# Patient Record
Sex: Female | Born: 2000
Health system: Southern US, Community
[De-identification: ages and names within clinical notes are randomized; demographics above are authoritative.]

## PROBLEM LIST (undated history)

## (undated) DIAGNOSIS — K219 Gastro-esophageal reflux disease without esophagitis: Secondary | ICD-10-CM

## (undated) DIAGNOSIS — D649 Anemia, unspecified: Secondary | ICD-10-CM

## (undated) DIAGNOSIS — R634 Abnormal weight loss: Secondary | ICD-10-CM

## (undated) DIAGNOSIS — Z8489 Family history of other specified conditions: Secondary | ICD-10-CM

## (undated) HISTORY — PX: ADENOIDECTOMY: SHX5191

## (undated) HISTORY — DX: Gastro-esophageal reflux disease without esophagitis: K21.9

## (undated) HISTORY — PX: TYMPANOSTOMY TUBE PLACEMENT: SHX32

## (undated) HISTORY — PX: DENTAL REHABILITATION: SHX1449

## (undated) HISTORY — DX: Anemia, unspecified: D64.9

## (undated) HISTORY — DX: Abnormal weight loss: R63.4

## (undated) HISTORY — DX: Family history of other specified conditions: Z84.89

---
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
## 2021-02-04 ENCOUNTER — Other Ambulatory Visit (HOSPITAL_COMMUNITY): Payer: Self-pay

## 2021-02-04 MED ORDER — LEVOCETIRIZINE DIHYDROCHLORIDE 5 MG PO TABS
5.0000 mg | ORAL_TABLET | Freq: Every evening | ORAL | 1 refills | Status: DC
  Filled 2021-02-04: qty 90, 90d supply, fill #0

## 2021-02-05 ENCOUNTER — Other Ambulatory Visit (HOSPITAL_COMMUNITY): Payer: Self-pay

## 2021-02-08 ENCOUNTER — Other Ambulatory Visit (HOSPITAL_COMMUNITY): Payer: Self-pay

## 2021-02-09 ENCOUNTER — Other Ambulatory Visit (HOSPITAL_COMMUNITY): Payer: Self-pay

## 2021-03-10 ENCOUNTER — Other Ambulatory Visit (HOSPITAL_COMMUNITY): Payer: Self-pay

## 2021-03-10 MED ORDER — MONTELUKAST SODIUM 10 MG PO TABS
ORAL_TABLET | ORAL | 1 refills | Status: DC
  Filled 2021-03-10: qty 90, 90d supply, fill #0

## 2021-03-15 ENCOUNTER — Other Ambulatory Visit (HOSPITAL_COMMUNITY): Payer: Self-pay

## 2021-03-25 ENCOUNTER — Other Ambulatory Visit: Payer: Self-pay

## 2021-03-25 ENCOUNTER — Ambulatory Visit (INDEPENDENT_AMBULATORY_CARE_PROVIDER_SITE_OTHER): Payer: 59 | Admitting: Gastroenterology

## 2021-03-25 ENCOUNTER — Encounter: Payer: Self-pay | Admitting: Gastroenterology

## 2021-03-25 VITALS — BP 114/78 | HR 66 | Temp 97.7°F | Ht 66.0 in | Wt 113.0 lb

## 2021-03-25 DIAGNOSIS — K580 Irritable bowel syndrome with diarrhea: Secondary | ICD-10-CM | POA: Diagnosis not present

## 2021-03-25 MED ORDER — RIFAXIMIN 550 MG PO TABS: 550.0000 mg | ORAL_TABLET | Freq: Three times a day (TID) | ORAL | 0 refills | Status: DC

## 2021-03-25 NOTE — Progress Notes (Signed)
Cephas Darby, MD [address]  [place]  [place], [place] [address]  Main: [phone]  Fax: [phone]    Gastroenterology Consultation  Referring Provider:     Don Broach, FNP Primary Care Physician:  Don Broach, FNP Primary Gastroenterologist:  Dr. Lucilla Lame Reason for Consultation:    Left lower quadrant pain and diarrhea        HPI:   April Patterson is a 20 y.o. female referred by Dr. Don Broach, FNP  for consultation & management of approximately 3 months history of nonbloody diarrhea associated with left lower quadrant pain.  Patient was originally seen at the [facility] in end of October secondary to 3 to 4 weeks history of left lower quadrant abdominal pain associated with 4-5 episodes of watery bowel movements, postprandial, denies any rectal bleeding.  Patient was treated with antibiotics for sinusitis prior to the onset of GI symptoms.  She has been avoiding gluten as she feels she is gluten sensitive.  Stool cultures were negative for Salmonella, Shigella, Campylobacter or E. coli.  She has been taking Bentyl 10 mg 4 times daily, later increased to 20 mg 4 times daily, distressed used to her diarrhea to 1-2 times a day but has resulted in dryness of mouth.  Other labs including CBC with differential and CMP were unremarkable.  Due to persistent symptoms, she underwent CT abdomen and pelvis with contrast which revealed prominent adnexal vasculature bilaterally with enlargement of the left ovarian vein suggestive of pelvic congestion syndrome, moderate amount of nonspecific free pelvic fluid.  Patient is waiting to be seen by vascular for prominent ovarian vein.  Patient reports her menstrual cycles are regular, her GI symptoms are worsened during menstruation.  She denies any menorrhagia or severe menstrual pain. Patient was seen by Dr. Allen Norris on 08/23/2020 for her GI symptoms, she had strong family history of Crohn's  disease.  Work-up revealed elevated fecal calprotectin levels, celiac panel was negative, pancreatic fecal elastase levels were low.  Given these findings, patient was made an urgent follow-up to see me for further evaluation of inflammatory bowel disease  Patient does not smoke or drink alcohol  Follow-up televisit 09/16/2020 I initially met April Patterson on 08/27/2020 secondary to approximately 3 months history of nonbloody diarrhea and left lower quadrant pain.  She underwent work-up including stool studies which were negative for infection.  Her fecal calprotectin levels were mildly elevated as well as pancreatic elastase levels were low.  Therefore, she underwent upper endoscopy as well as colonoscopy which were all unremarkable including biopsies.  There is no evidence of inflammatory bowel disease, celiac disease, H. pylori infection.  Given her elevated calprotectin levels, I have empirically started her on budesonide 9 mg daily.  We arranged a phone follow-up visit to discuss about biopsy results and next steps.  Although the terminal ileum and colonoscopy appeared somewhat inflamed, pathology did not confirm Crohn's disease.  There is no evidence of any ileitis.  Patient reports that she notices modest improvement in her symptoms since starting budesonide.  She still takes Imodium as needed.  She is not taking Bentyl.  She continues to restrict her diet, avoid gluten.  Follow-up visit 11/23/2020 Patient reports significant improvement in her GI symptoms.  She occasionally experiences abdominal cramps when she wakes up in the morning associated with an episode of diarrhea.  She is taking amitriptyline 25 mg at bedtime.  She has not taken any as  needed medications for her GI symptoms in the last several weeks.  She is no longer on budesonide.  Her CFTR gene mutation for cystic fibrosis panel came back negative.   Follow-up visit 03/25/2021 Patient reports doing well overall.  Today, she is mostly concerned  about regurgitation of food for which she is taking omeprazole 20 mg daily before dinner, it helps to alleviate her symptoms.  She was previously taking famotidine which partially helped.  With regards to diarrhea and left lower quadrant pain, she states that her symptoms are under control as long as she takes VSL #3 daily.  She finds this probiotic to be very expensive and has to remember taking it every day.  She is currently working in a summer camp, has been able to eat 3 meals daily and gaining weight gradually.  Patient is accompanied by her mom today.  She does have abdominal bloating. Patient has been off amitriptyline.  She tried to reintroduce gluten which has resulted in worsening of her GI symptoms.  She states that she can tolerate a slice of bread and small amounts of wheat periodically.  NSAIDs: None  Antiplts/Anticoagulants/Anti thrombotics: None  GI Procedures: EGD and colonoscopy 09/02/2020 Normal   DIAGNOSIS:  A. DUODENUM; COLD BIOPSY:  - ENTERIC MUCOSA WITH PRESERVED VILLOUS ARCHITECTURE AND NO SIGNIFICANT  HISTOPATHOLOGIC CHANGE.  - NEGATIVE FOR FEATURES OF CELIAC, DYSPLASIA, AND MALIGNANCY.   B. STOMACH, RANDOM; COLD BIOPSY:  - GASTRIC ANTRAL AND OXYNTIC MUCOSA WITH NO SIGNIFICANT HISTOPATHOLOGIC  CHANGE.  - NEGATIVE FOR H. PYLORI, DYSPLASIA, AND MALIGNANCY.   C. TERMINAL ILEUM; COLD BIOPSY:  - ENTERIC MUCOSA WITH NORMAL VILLOUS ARCHITECTURE AND REACTIVE  FOLLICULAR LYMPHOID HYPERPLASIA.  - NEGATIVE FOR FEATURES OF ACTIVE ILEITIS.  - NEGATIVE FOR GRANULOMA, DYSPLASIA, AND MALIGNANCY.   D. COLON, RIGHT; COLD BIOPSY:  - BENIGN COLONIC MUCOSA WITH NO SIGNIFICANT HISTOPATHOLOGIC CHANGE.  - NEGATIVE FOR MICROSCOPIC COLITIS, ACTIVE MUCOSAL COLITIS, AND  ARCHITECTURAL CHANGES OF CHRONIC COLITIS.  - NEGATIVE FOR GRANULOMA, DYSPLASIA, AND MALIGNANCY.   E. COLON, LEFT; COLD BIOPSY:  - BENIGN COLONIC MUCOSA WITH FOCAL NON-SPECIFIC SUPERFICIAL ACTIVE  MUCOSAL  INFLAMMATION.  - NEGATIVE FOR MICROSCOPIC COLITIS AND ARCHITECTURAL CHANGES OF CHRONIC  COLITIS.  - NEGATIVE FOR GRANULOMA, DYSPLASIA, AND MALIGNANCY.   F. RECTUM; COLD BIOPSY:  - BENIGN RECTAL MUCOSA WITH FOCAL NON-SPECIFIC SUPERFICIAL ACTIVE  MUCOSAL INFLAMMATION.  - NEGATIVE FOR MICROSCOPIC PROCTITIS AND ARCHITECTURAL CHANGES OF  CHRONIC PROCTITIS.  - NEGATIVE FOR GRANULOMA, DYSPLASIA, AND MALIGNANCY.   Comment:  Minute foci of nonspecific active mucosal inflammation are identified at  the mucosal surface in sampling of the left colon and rectum.  The  significance of these changes is unclear.  These findings may be seen  secondary to medication, bowel preparation, or self-limited infection.  Clinical correlation is recommended. Grandparents with history of Crohn's and maternal aunt with history of Crohn's  Past Medical History:  Diagnosis Date   Anemia    Family history of adverse reaction to anesthesia    Mother - needed Narcan after hysterectomy   GERD (gastroesophageal reflux disease)    Loss of weight     Past Surgical History:  Procedure Laterality Date   ADENOIDECTOMY     BIOPSY N/A 09/02/2020   Procedure: BIOPSY;  Surgeon: Lin Landsman, MD;  Location: [facility];  Service: Endoscopy;  Laterality: N/A;   COLONOSCOPY WITH PROPOFOL N/A 09/02/2020   Procedure: COLONOSCOPY WITH PROPOFOL;  Surgeon: Lin Landsman, MD;  Location: [facility];  Service: Endoscopy;  Laterality: N/A;   DENTAL REHABILITATION     ESOPHAGOGASTRODUODENOSCOPY (EGD) WITH PROPOFOL N/A 09/02/2020   Procedure: ESOPHAGOGASTRODUODENOSCOPY (EGD) WITH PROPOFOL;  Surgeon: Lin Landsman, MD;  Location: [facility];  Service: Endoscopy;  Laterality: N/A;   GIVENS CAPSULE STUDY N/A 10/04/2020   Procedure: GIVENS CAPSULE STUDY;  Surgeon: Lin Landsman, MD;  Location: [facility];  Service: Gastroenterology;  Laterality: N/A;   TYMPANOSTOMY TUBE PLACEMENT      WISDOM TOOTH EXTRACTION  2021    Current Outpatient Medications:    clobetasol (TEMOVATE) 0.05 % external solution, APPLY 2 TIMES DAILY, Disp: 50 mL, Rfl: 5   clobetasol ointment (TEMOVATE) 0.93 %, Apply 1 application topically 2 (two) times daily., Disp: , Rfl:    Dermatological Products, Misc. (CERAMAX) CREA, Apply topically to skin as needed, Disp: , Rfl:    EUCRISA 2 % OINT, , Disp: , Rfl: 3   famotidine (PEPCID) 10 MG tablet, Take 10 mg by mouth 2 (two) times daily., Disp: , Rfl:    ferrous sulfate 325 (65 FE) MG tablet, Take by mouth., Disp: , Rfl:    fluticasone (FLONASE) 50 MCG/ACT nasal spray, PLACE 1 SPRAY INTO EACH NOSTRIL TWICE DAILY., Disp: 48 g, Rfl: 3   ketoconazole (NIZORAL) 2 % shampoo, APPLY TOPICALLY 2 TIMES A WEEK. SHAMPOO FACE, SCALP, AND EARS TWICE A WEEK. LET LATHER SIT FOR 2-3 MINUTES BEFORE RINSING., Disp: 120 mL, Rfl: 4   Lactobacillus Rhamnosus, GG, (RA PROBIOTIC DIGESTIVE CARE) CAPS, Take 1 capsule by mouth daily., Disp: , Rfl:    levocetirizine (XYZAL) 5 MG tablet, TAKE 1 TABLET BY MOUTH EVERY EVENING., Disp: 90 tablet, Rfl: 1   loperamide (IMODIUM A-D) 2 MG tablet, Take by mouth., Disp: , Rfl:    Melatonin 3 MG CAPS, Take by mouth., Disp: , Rfl:    montelukast (SINGULAIR) 10 MG tablet, TAKE 1 TABLET BY MOUTH NIGHTLY., Disp: 90 tablet, Rfl: 1   Multiple Vitamins-Minerals (MULTIVITAMIN GUMMIES ADULT PO), Take by mouth., Disp: , Rfl:    OVER THE COUNTER MEDICATION, [employer] - Magnesium, Vitamin B12, Riboflavin, Disp: , Rfl:    OVER THE COUNTER MEDICATION, [place] - TriEase, Disp: , Rfl:    rifaximin (XIFAXAN) 550 MG TABS tablet, Take 1 tablet (550 mg total) by mouth 3 (three) times daily for 14 days., Disp: 20 tablet, Rfl: 0   No family history on file.   Social History   Tobacco Use   Smoking status: Never   Smokeless tobacco: Never  Vaping Use   Vaping Use: Never used  Substance Use Topics   Alcohol use: Not Currently   Drug use: Never    Allergies  as of 03/25/2021 - Review Complete 03/25/2021  Allergen Reaction Noted   Prednisone  10/01/2018   Wheat bran Diarrhea 08/20/2020    Review of Systems:    All systems reviewed and negative except where noted in HPI.   Physical Exam:  BP 114/78 (BP Location: Left Arm, Patient Position: Sitting, Cuff Size: Normal)   Pulse 66   Temp 97.7 F (36.5 C) (Oral)   Ht 5' 6"  (1.676 m)   Wt 113 lb (51.3 kg)   BMI 18.24 kg/m  No LMP recorded.  General:   Alert, thin built, moderately nourished, pleasant and cooperative in NAD Head:  Normocephalic and atraumatic. Eyes:  Sclera clear, no icterus.   Conjunctiva pink. Ears:  Normal auditory acuity. Nose:  No deformity, discharge, or lesions. Mouth:  No deformity or lesions,oropharynx pink & moist. Neck:  Supple; no masses or thyromegaly. Lungs:  Respirations even and unlabored.  Clear throughout to auscultation.   No wheezes, crackles, or rhonchi. No acute distress. Heart:  Regular rate and rhythm; no murmurs, clicks, rubs, or gallops. Abdomen:  Normal bowel sounds. Soft, nontender, mildly distended without masses, hepatosplenomegaly or hernias noted.  No guarding or rebound tenderness.   Rectal: Not performed Msk:  Symmetrical without gross deformities. Good, equal movement & strength bilaterally. Pulses:  Normal pulses noted. Extremities:  No clubbing or edema.  No cyanosis. Neurologic:  Alert and oriented x3;  grossly normal neurologically. Skin:  Intact without significant lesions or rashes. No jaundice. Lymph Nodes:  No significant cervical adenopathy. Psych:  Alert and cooperative. Normal mood and affect.  Imaging Studies: Reviewed  Assessment and Plan:   April Patterson is a 20 y.o. female with family history of Crohn's disease, seen in consultation for 3 months history of nonbloody diarrhea associated with left lower quadrant pain, abdominal bloating.  Stool cultures negative for infectious etiology including C. difficile. Elevated  fecal calprotectin levels and low pancreatic fecal elastase levels.    Upper endoscopy as well as colonoscopy did not reveal any evidence of inflammatory bowel disease.  S/p treatment with 1 month of budesonide 9 mg daily given elevated fecal calprotectin levels which have resulted in significant improvement of her symptoms.  CT enterography was unremarkable.  Small bowel video capsule endoscopy was normal  Symptoms have significantly resolved at this time, currently mild episodes of early morning abdominal cramps and loose stool about once a week.  Repeat fecal calprotectin levels as well as pancreatic fecal elastase levels are normal. Will try 2 weeks course of rifaximin for diarrhea predominant IBS She will not take probiotic VSL#3 while on antibiotic course Reintroduce gluten gradually Currently off amitriptyline Do not recommend budesonide or any treatment for IBD at this time  Prominent ovarian vein, free fluid in the pelvis, congestion of adnexal vasculature, patient is evaluated by vascular at [facility] and no intervention is recommended at this time   Follow up in 4 months   Cephas Darby, MD

## 2021-03-26 MED FILL — Ketoconazole Shampoo 2%: CUTANEOUS | 30 days supply | Qty: 120 | Fill #0 | Status: AC

## 2021-03-26 MED FILL — Fluticasone Propionate Nasal Susp 50 MCG/ACT: NASAL | 90 days supply | Qty: 48 | Fill #0 | Status: AC

## 2021-03-28 ENCOUNTER — Other Ambulatory Visit (HOSPITAL_COMMUNITY): Payer: Self-pay

## 2021-03-29 ENCOUNTER — Encounter: Payer: Self-pay | Admitting: Gastroenterology

## 2021-03-29 ENCOUNTER — Other Ambulatory Visit (HOSPITAL_COMMUNITY): Payer: Self-pay

## 2021-03-29 MED ORDER — EUCRISA 2 % EX OINT
TOPICAL_OINTMENT | CUTANEOUS | 6 refills | Status: AC
  Filled 2021-03-29: qty 100, 30d supply, fill #0
  Filled 2021-09-19: qty 100, 30d supply, fill #1

## 2021-03-29 MED ORDER — RIFAXIMIN 550 MG PO TABS: 550.0000 mg | ORAL_TABLET | Freq: Three times a day (TID) | ORAL | 0 refills | Status: AC

## 2021-03-30 ENCOUNTER — Other Ambulatory Visit (HOSPITAL_COMMUNITY): Payer: Self-pay

## 2021-04-06 ENCOUNTER — Other Ambulatory Visit (HOSPITAL_COMMUNITY): Payer: Self-pay

## 2021-04-15 ENCOUNTER — Other Ambulatory Visit (HOSPITAL_COMMUNITY): Payer: Self-pay

## 2021-04-15 MED FILL — Ketoconazole Shampoo 2%: CUTANEOUS | 30 days supply | Qty: 120 | Fill #1 | Status: AC

## 2021-04-17 ENCOUNTER — Encounter: Payer: Self-pay | Admitting: Gastroenterology

## 2021-04-18 ENCOUNTER — Other Ambulatory Visit (HOSPITAL_COMMUNITY): Payer: Self-pay

## 2021-04-18 DIAGNOSIS — J301 Allergic rhinitis due to pollen: Secondary | ICD-10-CM | POA: Diagnosis not present

## 2021-04-19 ENCOUNTER — Other Ambulatory Visit (HOSPITAL_COMMUNITY): Payer: Self-pay

## 2021-04-19 MED ORDER — LEVOCETIRIZINE DIHYDROCHLORIDE 5 MG PO TABS
5.0000 mg | ORAL_TABLET | Freq: Every evening | ORAL | 1 refills | Status: DC
  Filled 2021-04-19: qty 90, 90d supply, fill #0
  Filled 2021-07-23: qty 90, 90d supply, fill #1

## 2021-04-20 ENCOUNTER — Other Ambulatory Visit (HOSPITAL_COMMUNITY): Payer: Self-pay

## 2021-04-21 ENCOUNTER — Other Ambulatory Visit (HOSPITAL_COMMUNITY): Payer: Self-pay

## 2021-04-22 ENCOUNTER — Other Ambulatory Visit (HOSPITAL_COMMUNITY): Payer: Self-pay

## 2021-04-25 ENCOUNTER — Other Ambulatory Visit (HOSPITAL_COMMUNITY): Payer: Self-pay

## 2021-04-27 ENCOUNTER — Other Ambulatory Visit (HOSPITAL_COMMUNITY): Payer: Self-pay

## 2021-06-09 ENCOUNTER — Other Ambulatory Visit: Payer: Self-pay

## 2021-06-09 MED ORDER — CARESTART COVID-19 HOME TEST VI KIT
PACK | 0 refills | Status: AC
  Filled 2021-06-09: qty 2, 4d supply, fill #0

## 2021-06-14 ENCOUNTER — Other Ambulatory Visit (HOSPITAL_COMMUNITY): Payer: Self-pay

## 2021-06-14 MED FILL — Fluticasone Propionate Nasal Susp 50 MCG/ACT: NASAL | 90 days supply | Qty: 48 | Fill #1 | Status: AC

## 2021-06-17 ENCOUNTER — Other Ambulatory Visit (HOSPITAL_COMMUNITY): Payer: Self-pay

## 2021-06-18 ENCOUNTER — Other Ambulatory Visit (HOSPITAL_COMMUNITY): Payer: Self-pay

## 2021-06-21 ENCOUNTER — Other Ambulatory Visit (HOSPITAL_COMMUNITY): Payer: Self-pay

## 2021-06-21 MED ORDER — MONTELUKAST SODIUM 10 MG PO TABS
ORAL_TABLET | ORAL | 0 refills | Status: DC
  Filled 2021-06-21: qty 90, 90d supply, fill #0

## 2021-07-15 ENCOUNTER — Other Ambulatory Visit (HOSPITAL_COMMUNITY): Payer: Self-pay

## 2021-07-23 MED FILL — Clobetasol Propionate Soln 0.05%: CUTANEOUS | 30 days supply | Qty: 50 | Fill #0 | Status: AC

## 2021-07-24 ENCOUNTER — Other Ambulatory Visit (HOSPITAL_COMMUNITY): Payer: Self-pay

## 2021-07-25 ENCOUNTER — Other Ambulatory Visit (HOSPITAL_COMMUNITY): Payer: Self-pay

## 2021-08-23 ENCOUNTER — Other Ambulatory Visit (HOSPITAL_COMMUNITY): Payer: Self-pay

## 2021-08-23 DIAGNOSIS — J4520 Mild intermittent asthma, uncomplicated: Secondary | ICD-10-CM | POA: Diagnosis not present

## 2021-08-23 DIAGNOSIS — Z9109 Other allergy status, other than to drugs and biological substances: Secondary | ICD-10-CM | POA: Diagnosis not present

## 2021-08-23 DIAGNOSIS — Z0000 Encounter for general adult medical examination without abnormal findings: Secondary | ICD-10-CM | POA: Diagnosis not present

## 2021-08-23 MED ORDER — FLUTICASONE PROPIONATE 50 MCG/ACT NA SUSP
NASAL | 3 refills | Status: DC
  Filled 2021-08-23: qty 48, 90d supply, fill #0
  Filled 2022-04-05: qty 48, 90d supply, fill #1

## 2021-08-23 MED ORDER — LEVOCETIRIZINE DIHYDROCHLORIDE 5 MG PO TABS
5.0000 mg | ORAL_TABLET | Freq: Every evening | ORAL | 3 refills | Status: AC
  Filled 2021-08-23 – 2021-10-30 (×2): qty 90, 90d supply, fill #0
  Filled 2022-02-08: qty 90, 90d supply, fill #1
  Filled 2022-04-05 – 2022-04-21 (×2): qty 90, 90d supply, fill #2

## 2021-08-23 MED ORDER — MONTELUKAST SODIUM 10 MG PO TABS
ORAL_TABLET | ORAL | 3 refills | Status: DC
  Filled 2021-08-23: qty 90, 90d supply, fill #0
  Filled 2021-12-16: qty 90, 90d supply, fill #1
  Filled 2022-03-13 – 2022-06-12 (×2): qty 90, 90d supply, fill #0

## 2021-08-25 ENCOUNTER — Ambulatory Visit (INDEPENDENT_AMBULATORY_CARE_PROVIDER_SITE_OTHER): Payer: 59 | Admitting: Gastroenterology

## 2021-08-25 ENCOUNTER — Other Ambulatory Visit (HOSPITAL_COMMUNITY): Payer: Self-pay

## 2021-08-25 ENCOUNTER — Encounter: Payer: Self-pay | Admitting: Gastroenterology

## 2021-08-25 VITALS — BP 105/69 | HR 70 | Temp 98.4°F | Ht 66.0 in | Wt 113.2 lb

## 2021-08-25 DIAGNOSIS — K580 Irritable bowel syndrome with diarrhea: Secondary | ICD-10-CM

## 2021-08-25 NOTE — Progress Notes (Signed)
April Darby, MD [address]  [place]  [place], [place] [address]  Main: [phone]  Fax: [phone]    Gastroenterology Consultation  Referring Provider:     Don Broach, FNP Primary Care Physician:  April Broach, FNP Primary Gastroenterologist:  Dr. Lucilla Patterson Reason for Consultation:    Left lower quadrant pain and diarrhea        HPI:   April Patterson is a 20 y.o. female referred by Dr. Don Broach, FNP  for consultation & management of approximately 3 months history of nonbloody diarrhea associated with left lower quadrant pain.  Patient was originally seen at the [facility] in end of October secondary to 3 to 4 weeks history of left lower quadrant abdominal pain associated with 4-5 episodes of watery bowel movements, postprandial, denies any rectal bleeding.  Patient was treated with antibiotics for sinusitis prior to the onset of GI symptoms.  She has been avoiding gluten as she feels she is gluten sensitive.  Stool cultures were negative for Salmonella, Shigella, Campylobacter or E. coli.  She has been taking Bentyl 10 mg 4 times daily, later increased to 20 mg 4 times daily, distressed used to her diarrhea to 1-2 times a day but has resulted in dryness of mouth.  Other labs including CBC with differential and CMP were unremarkable.  Due to persistent symptoms, she underwent CT abdomen and pelvis with contrast which revealed prominent adnexal vasculature bilaterally with enlargement of the left ovarian vein suggestive of pelvic congestion syndrome, moderate amount of nonspecific free pelvic fluid.  Patient is waiting to be seen by vascular for prominent ovarian vein.  Patient reports her menstrual cycles are regular, her GI symptoms are worsened during menstruation.  She denies any menorrhagia or severe menstrual pain. Patient was seen by Dr. Allen Patterson on 08/23/2020 for her GI symptoms, she had strong family history of Crohn's  disease.  Work-up revealed elevated fecal calprotectin levels, celiac panel was negative, pancreatic fecal elastase levels were low.  Given these findings, patient was made an urgent follow-up to see me for further evaluation of inflammatory bowel disease  Patient does not smoke or drink alcohol  Follow-up televisit 09/16/2020 I initially met April Patterson on 08/27/2020 secondary to approximately 3 months history of nonbloody diarrhea and left lower quadrant pain.  She underwent work-up including stool studies which were negative for infection.  Her fecal calprotectin levels were mildly elevated as well as pancreatic elastase levels were low.  Therefore, she underwent upper endoscopy as well as colonoscopy which were all unremarkable including biopsies.  There is no evidence of inflammatory bowel disease, celiac disease, H. pylori infection.  Given her elevated calprotectin levels, I have empirically started her on budesonide 9 mg daily.  We arranged a phone follow-up visit to discuss about biopsy results and next steps.  Although the terminal ileum and colonoscopy appeared somewhat inflamed, pathology did not confirm Crohn's disease.  There is no evidence of any ileitis.  Patient reports that she notices modest improvement in her symptoms since starting budesonide.  She still takes Imodium as needed.  She is not taking Bentyl.  She continues to restrict her diet, avoid gluten.  Follow-up visit 11/23/2020 Patient reports significant improvement in her GI symptoms.  She occasionally experiences abdominal cramps when she wakes up in the morning associated with an episode of diarrhea.  She is taking amitriptyline 25 mg at bedtime.  She has not taken any as  needed medications for her GI symptoms in the last several weeks.  She is no longer on budesonide.  Her CFTR gene mutation for cystic fibrosis panel came back negative.   Follow-up visit 03/25/2021 Patient reports doing well overall.  Today, she is mostly concerned  about regurgitation of food for which she is taking omeprazole 20 mg daily before dinner, it helps to alleviate her symptoms.  She was previously taking famotidine which partially helped.  With regards to diarrhea and left lower quadrant pain, she states that her symptoms are under control as long as she takes VSL #3 daily.  She finds this probiotic to be very expensive and has to remember taking it every day.  She is currently working in a summer camp, has been able to eat 3 meals daily and gaining weight gradually.  Patient is accompanied by her mom today.  She does have abdominal bloating. Patient has been off amitriptyline.  She tried to reintroduce gluten which has resulted in worsening of her GI symptoms.  She states that she can tolerate a slice of bread and small amounts of wheat periodically.  Follow-up visit 08/25/2021 Patient is doing well overall.  She is accompanied by her mom today.  Patient tried antibiotic and stop probiotic which did not help with her GI symptoms.  She is suffering from irritable bowel syndrome.  She has restarted VSL number three 1 capsule daily which keeps her symptoms under control.  She has been pleased with feeling well overall and able to focus final year of college.  She continues to take omeprazole 20 mg once a day and Pepcid as needed.  She is not able to tolerate gluten.  Her weight has been stable  NSAIDs: None  Antiplts/Anticoagulants/Anti thrombotics: None  GI Procedures: EGD and colonoscopy 09/02/2020 Normal   DIAGNOSIS:  A. DUODENUM; COLD BIOPSY:  - ENTERIC MUCOSA WITH PRESERVED VILLOUS ARCHITECTURE AND NO SIGNIFICANT  HISTOPATHOLOGIC CHANGE.  - NEGATIVE FOR FEATURES OF CELIAC, DYSPLASIA, AND MALIGNANCY.   B. STOMACH, RANDOM; COLD BIOPSY:  - GASTRIC ANTRAL AND OXYNTIC MUCOSA WITH NO SIGNIFICANT HISTOPATHOLOGIC  CHANGE.  - NEGATIVE FOR H. PYLORI, DYSPLASIA, AND MALIGNANCY.   C. TERMINAL ILEUM; COLD BIOPSY:  - ENTERIC MUCOSA WITH NORMAL VILLOUS  ARCHITECTURE AND REACTIVE  FOLLICULAR LYMPHOID HYPERPLASIA.  - NEGATIVE FOR FEATURES OF ACTIVE ILEITIS.  - NEGATIVE FOR GRANULOMA, DYSPLASIA, AND MALIGNANCY.   D. COLON, RIGHT; COLD BIOPSY:  - BENIGN COLONIC MUCOSA WITH NO SIGNIFICANT HISTOPATHOLOGIC CHANGE.  - NEGATIVE FOR MICROSCOPIC COLITIS, ACTIVE MUCOSAL COLITIS, AND  ARCHITECTURAL CHANGES OF CHRONIC COLITIS.  - NEGATIVE FOR GRANULOMA, DYSPLASIA, AND MALIGNANCY.   E. COLON, LEFT; COLD BIOPSY:  - BENIGN COLONIC MUCOSA WITH FOCAL NON-SPECIFIC SUPERFICIAL ACTIVE  MUCOSAL INFLAMMATION.  - NEGATIVE FOR MICROSCOPIC COLITIS AND ARCHITECTURAL CHANGES OF CHRONIC  COLITIS.  - NEGATIVE FOR GRANULOMA, DYSPLASIA, AND MALIGNANCY.   F. RECTUM; COLD BIOPSY:  - BENIGN RECTAL MUCOSA WITH FOCAL NON-SPECIFIC SUPERFICIAL ACTIVE  MUCOSAL INFLAMMATION.  - NEGATIVE FOR MICROSCOPIC PROCTITIS AND ARCHITECTURAL CHANGES OF  CHRONIC PROCTITIS.  - NEGATIVE FOR GRANULOMA, DYSPLASIA, AND MALIGNANCY.   Comment:  Minute foci of nonspecific active mucosal inflammation are identified at  the mucosal surface in sampling of the left colon and rectum.  The  significance of these changes is unclear.  These findings may be seen  secondary to medication, bowel preparation, or self-limited infection.  Clinical correlation is recommended. Grandparents with history of Crohn's and maternal aunt with history of Crohn's  Past Medical  History:  Diagnosis Date   Anemia    Family history of adverse reaction to anesthesia    Mother - needed Narcan after hysterectomy   GERD (gastroesophageal reflux disease)    Loss of weight     Past Surgical History:  Procedure Laterality Date   ADENOIDECTOMY     BIOPSY N/A 09/02/2020   Procedure: BIOPSY;  Surgeon: Lin Landsman, MD;  Location: [facility];  Service: Endoscopy;  Laterality: N/A;   COLONOSCOPY WITH PROPOFOL N/A 09/02/2020   Procedure: COLONOSCOPY WITH PROPOFOL;  Surgeon: Lin Landsman, MD;   Location: [facility];  Service: Endoscopy;  Laterality: N/A;   DENTAL REHABILITATION     ESOPHAGOGASTRODUODENOSCOPY (EGD) WITH PROPOFOL N/A 09/02/2020   Procedure: ESOPHAGOGASTRODUODENOSCOPY (EGD) WITH PROPOFOL;  Surgeon: Lin Landsman, MD;  Location: [facility];  Service: Endoscopy;  Laterality: N/A;   GIVENS CAPSULE STUDY N/A 10/04/2020   Procedure: GIVENS CAPSULE STUDY;  Surgeon: Lin Landsman, MD;  Location: [facility] ENDOSCOPY;  Service: Gastroenterology;  Laterality: N/A;   TYMPANOSTOMY TUBE PLACEMENT     WISDOM TOOTH EXTRACTION  2021    Current Outpatient Medications:    clobetasol (TEMOVATE) 0.05 % external solution, APPLY TOPICALLY 2 TIMES DAILY AS DIRECTED, Disp: 50 mL, Rfl: 5   clobetasol ointment (TEMOVATE) 2.35 %, Apply 1 application topically 2 (two) times daily., Disp: , Rfl:    COVID-19 At Home Antigen Test (CARESTART COVID-19 HOME TEST) KIT, use as directed, Disp: 2 kit, Rfl: 0   Crisaborole (EUCRISA) 2 % OINT, Apply topically 2 times a day as needed, Disp: 100 g, Rfl: 6   Dermatological Products, Misc. (CERAMAX) CREA, Apply topically to skin as needed, Disp: , Rfl:    EUCRISA 2 % OINT, , Disp: , Rfl: 3   famotidine (PEPCID) 10 MG tablet, Take 10 mg by mouth 2 (two) times daily., Disp: , Rfl:    ferrous sulfate 325 (65 FE) MG tablet, Take by mouth., Disp: , Rfl:    fluticasone (FLONASE) 50 MCG/ACT nasal spray, Use 1 spray into each nostril 2 times a day., Disp: 48 g, Rfl: 3   ketoconazole (NIZORAL) 2 % shampoo, APPLY TOPICALLY 2 TIMES A WEEK. SHAMPOO FACE, SCALP, AND EARS TWICE A WEEK. LET LATHER SIT FOR 2-3 MINUTES BEFORE RINSING., Disp: 120 mL, Rfl: 4   Lactobacillus Rhamnosus, GG, (RA PROBIOTIC DIGESTIVE CARE) CAPS, Take 1 capsule by mouth daily., Disp: , Rfl:    levocetirizine (XYZAL) 5 MG tablet, Take 1 tablet by mouth every evening., Disp: 90 tablet, Rfl: 3   loperamide (IMODIUM A-D) 2 MG tablet, Take by mouth., Disp: , Rfl:    Melatonin 3 MG  CAPS, Take by mouth., Disp: , Rfl:    montelukast (SINGULAIR) 10 MG tablet, Take 1 tablet by mouth nightly., Disp: 90 tablet, Rfl: 3   Multiple Vitamins-Minerals (MULTIVITAMIN GUMMIES ADULT PO), Take by mouth., Disp: , Rfl:    OVER THE COUNTER MEDICATION, [employer] - Magnesium, Vitamin B12, Riboflavin, Disp: , Rfl:    OVER THE COUNTER MEDICATION, [place] - TriEase, Disp: , Rfl:    levocetirizine (XYZAL) 5 MG tablet, TAKE 1 TABLET BY MOUTH EVERY EVENING., Disp: 90 tablet, Rfl: 1   History reviewed. No pertinent family history.   Social History   Tobacco Use   Smoking status: Never   Smokeless tobacco: Never  Vaping Use   Vaping Use: Never used  Substance Use Topics   Alcohol use: Not Currently   Drug use: Never  Allergies as of 08/25/2021 - Review Complete 08/25/2021  Allergen Reaction Noted   Prednisone  10/01/2018   Wheat bran Diarrhea 08/20/2020    Review of Systems:    All systems reviewed and negative except where noted in HPI.   Physical Exam:  BP 105/69 (BP Location: Right Arm, Patient Position: Sitting, Cuff Size: Normal)    Pulse 70    Temp 98.4 F (36.9 C) (Temporal)    Ht '5\' 6"'  (1.676 m)    Wt 113 lb 3.2 oz (51.3 kg)    BMI 18.27 kg/m  No LMP recorded.  General:   Alert, thin built, moderately nourished, pleasant and cooperative in NAD Head:  Normocephalic and atraumatic. Eyes:  Sclera clear, no icterus.   Conjunctiva pink. Ears:  Normal auditory acuity. Nose:  No deformity, discharge, or lesions. Mouth:  No deformity or lesions,oropharynx pink & moist. Neck:  Supple; no masses or thyromegaly. Lungs:  Respirations even and unlabored.  Clear throughout to auscultation.   No wheezes, crackles, or rhonchi. No acute distress. Heart:  Regular rate and rhythm; no murmurs, clicks, rubs, or gallops. Abdomen:  Normal bowel sounds. Soft, nontender, non distended without masses, hepatosplenomegaly or hernias noted.  No guarding or rebound tenderness.   Rectal: Not  performed Msk:  Symmetrical without gross deformities. Good, equal movement & strength bilaterally. Pulses:  Normal pulses noted. Extremities:  No clubbing or edema.  No cyanosis. Neurologic:  Alert and oriented x3;  grossly normal neurologically. Skin:  Intact without significant lesions or rashes. No jaundice. Lymph Nodes:  No significant cervical adenopathy. Psych:  Alert and cooperative. Normal mood and affect.  Imaging Studies: Reviewed  Assessment and Plan:   XOEY WARMOTH is a 20 y.o. female with family history of Crohn's disease, seen in consultation for 3 months history of nonbloody diarrhea associated with left lower quadrant pain, abdominal bloating.  Stool cultures negative for infectious etiology including C. difficile. Elevated fecal calprotectin levels and low pancreatic fecal elastase levels.    Upper endoscopy as well as colonoscopy did not reveal any evidence of inflammatory bowel disease.  S/p treatment with 1 month of budesonide 9 mg daily given elevated fecal calprotectin levels which have resulted in significant improvement of her symptoms.  CT enterography was unremarkable.  Small bowel video capsule endoscopy was normal. Patient's symptoms have significantly improved, repeat fecal calprotectin levels as well as pancreatic fecal elastase levels are normal.  She also tried 2 weeks course of rifaximin for diarrhea predominant IBS which did not help.  She is currently back on probiotic VSL#3 1 capsule daily which keeps her IBS symptoms under control.  Patient feels well overall.  She is planning [place] trip in summer, discussed with her to try align probiotic during travel which does not need to be refrigerated. She will continue to take omeprazole 20 mg once a day for reflux.  Pepcid as needed She will discontinue iron as her iron studies are normal  Follow up as needed   April Darby, MD

## 2021-09-01 ENCOUNTER — Other Ambulatory Visit (HOSPITAL_COMMUNITY): Payer: Self-pay

## 2021-09-04 MED FILL — Clobetasol Propionate Soln 0.05%: CUTANEOUS | 30 days supply | Qty: 50 | Fill #1 | Status: AC

## 2021-09-06 ENCOUNTER — Other Ambulatory Visit (HOSPITAL_COMMUNITY): Payer: Self-pay

## 2021-09-19 ENCOUNTER — Other Ambulatory Visit (HOSPITAL_COMMUNITY): Payer: Self-pay

## 2021-09-21 ENCOUNTER — Other Ambulatory Visit (HOSPITAL_COMMUNITY): Payer: Self-pay

## 2021-09-22 ENCOUNTER — Other Ambulatory Visit (HOSPITAL_COMMUNITY): Payer: Self-pay

## 2021-10-01 MED FILL — Ketoconazole Shampoo 2%: CUTANEOUS | 30 days supply | Qty: 120 | Fill #2 | Status: AC

## 2021-10-03 ENCOUNTER — Other Ambulatory Visit (HOSPITAL_COMMUNITY): Payer: Self-pay

## 2021-10-05 ENCOUNTER — Encounter: Payer: Self-pay | Admitting: Gastroenterology

## 2021-10-06 ENCOUNTER — Encounter: Payer: Self-pay | Admitting: Gastroenterology

## 2021-10-11 ENCOUNTER — Other Ambulatory Visit (HOSPITAL_COMMUNITY): Payer: Self-pay

## 2021-10-30 ENCOUNTER — Other Ambulatory Visit (HOSPITAL_COMMUNITY): Payer: Self-pay

## 2021-10-31 ENCOUNTER — Other Ambulatory Visit (HOSPITAL_COMMUNITY): Payer: Self-pay

## 2021-11-19 IMAGING — CT CT ENTEROGRAPHY (ABD-PELV W/ CM)
2 of 5 series · 16 of 46 positions shown, 18 images · IV contrast (omnipaque)
Comparison: None.

CLINICAL DATA: Worsening left lower quadrant pain and diarrhea 3
months, with 15 lb weight loss. Chronic diarrhea. Suspected Crohn
disease.

EXAM:
CT ABDOMEN AND PELVIS WITH CONTRAST (ENTEROGRAPHY)
TECHNIQUE: Multidetector CT of the abdomen and pelvis during bolus
administration of intravenous contrast. Negative oral contrast was
given.
CONTRAST:  75mL OMNIPAQUE IOHEXOL 300 MG/ML  SOLN

[Series 4: cor enterography 2.00 cor · coronal · 0.65mm/px · 3 of 109 slices shown]
[im 37/109  soft-tissue]
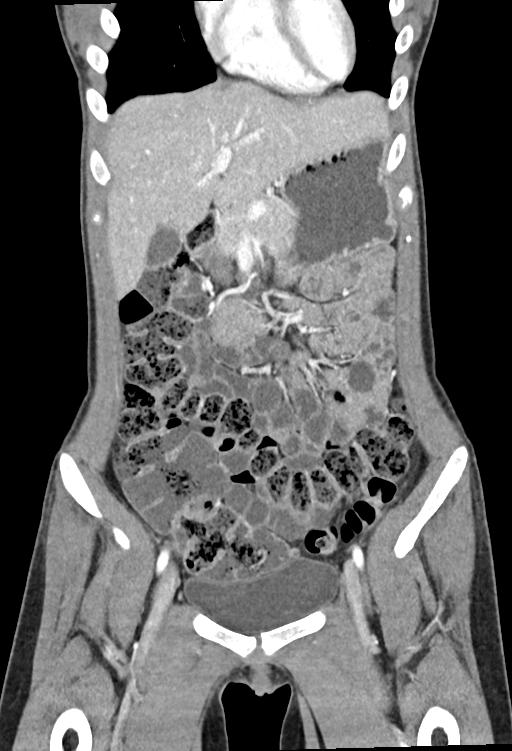
[im 49/109  soft-tissue]
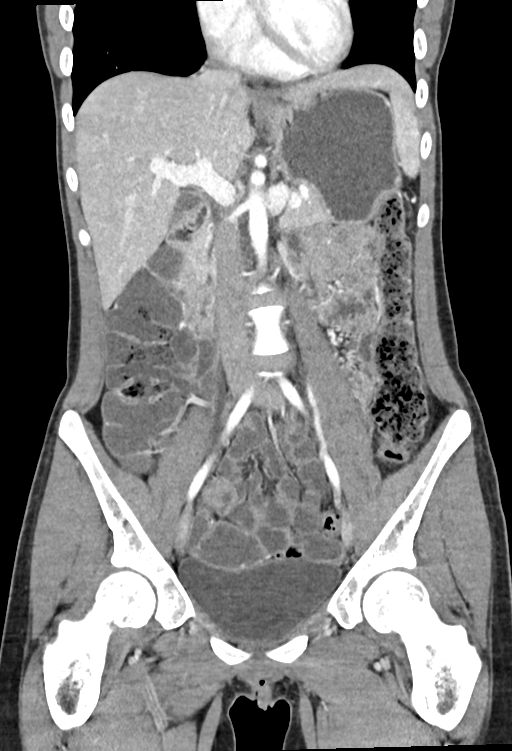
[im 61/109  soft-tissue]
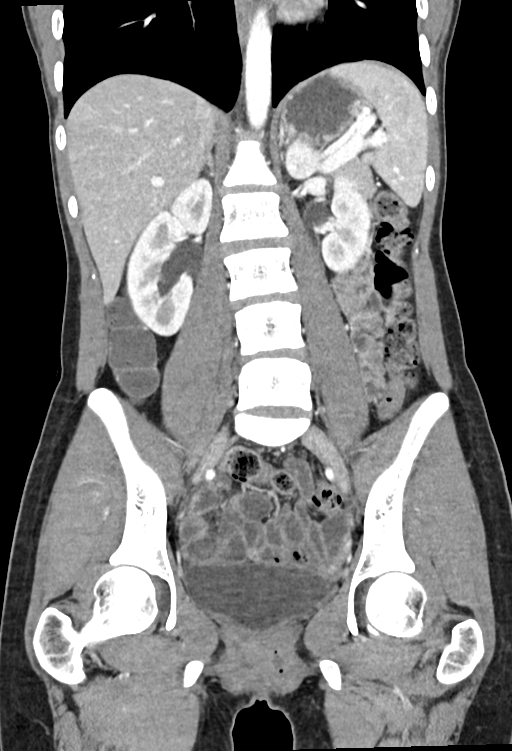

[Series 8: thins enterography 2.00 · axial · 0.65mm/px · z∈[-1529,-1093]mm · 13 of 244 slices shown, 15 images]
[im 13/244  soft-tissue]
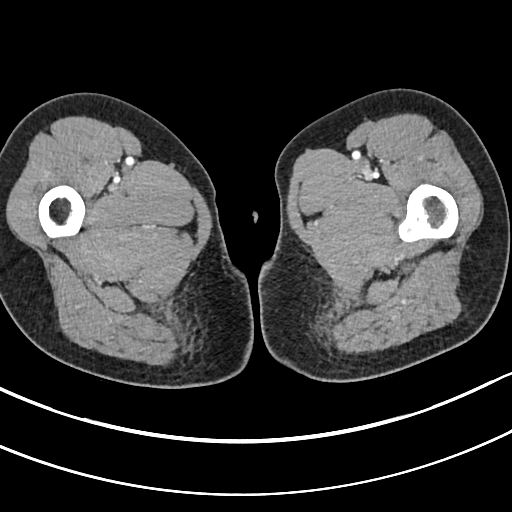
[im 13/244  bone]
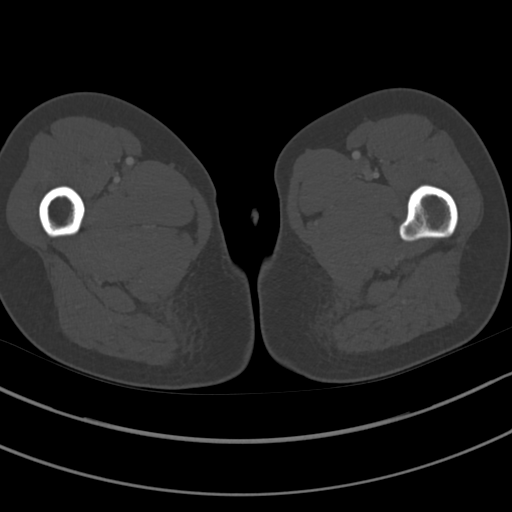
[im 37/244  soft-tissue]
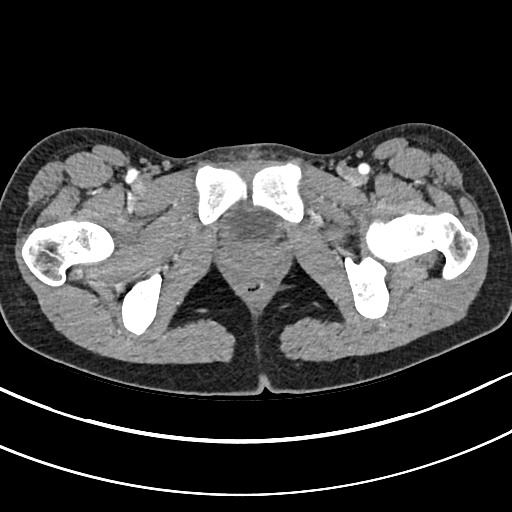
[im 49/244  soft-tissue]
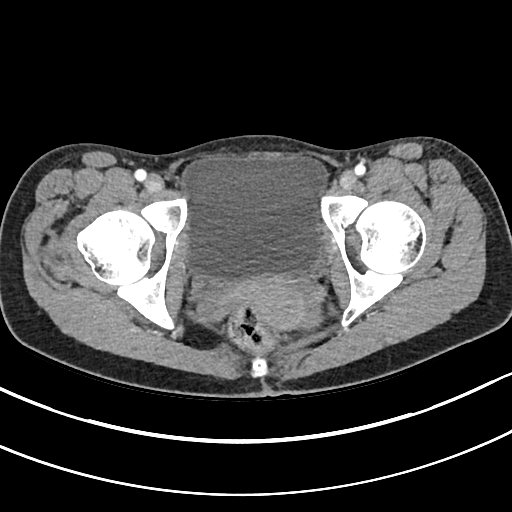
[im 73/244  soft-tissue]
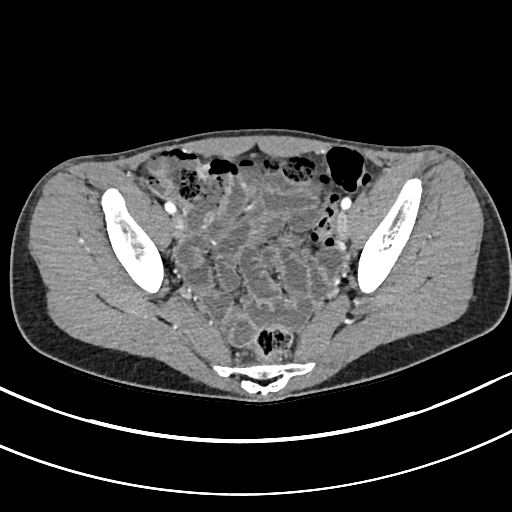
[im 86/244  soft-tissue]
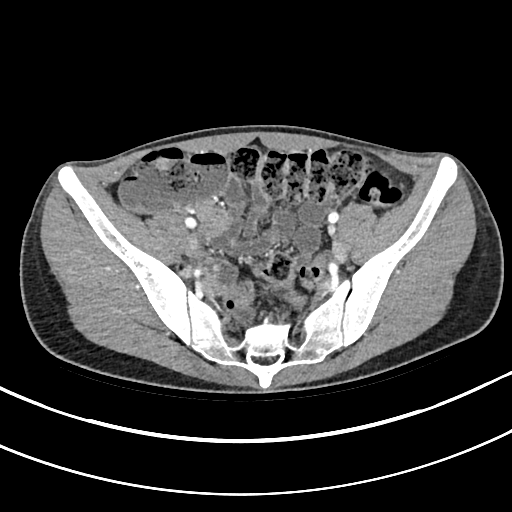
[im 110/244  soft-tissue]
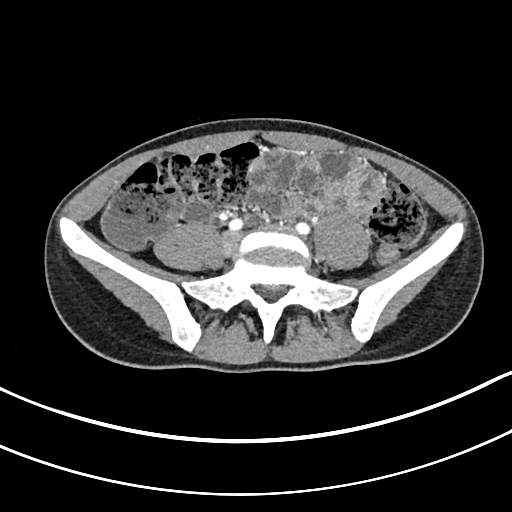
[im 122/244  soft-tissue]
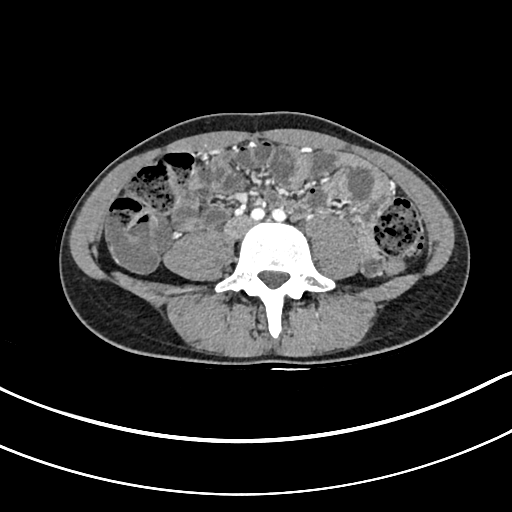
[im 134/244  soft-tissue]
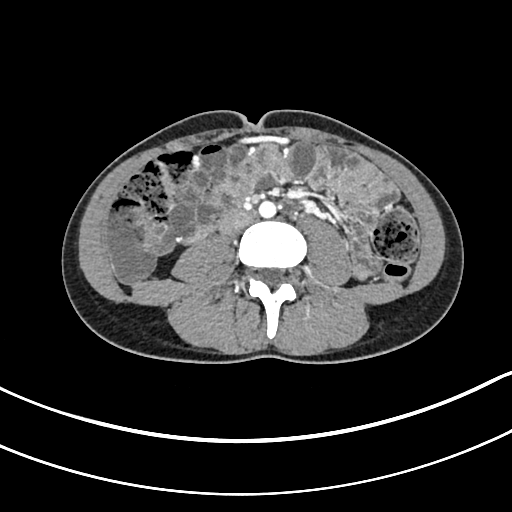
[im 158/244  soft-tissue]
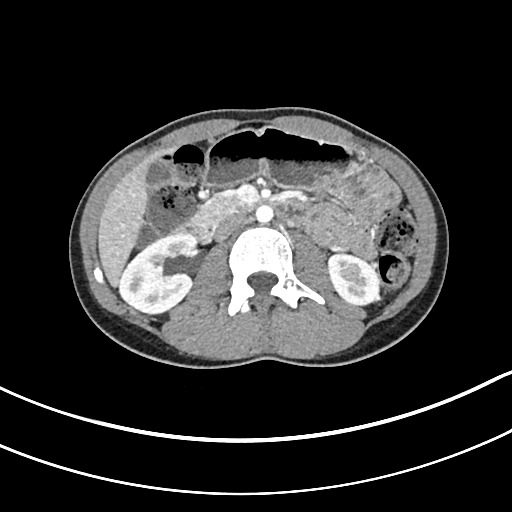
[im 158/244  bone]
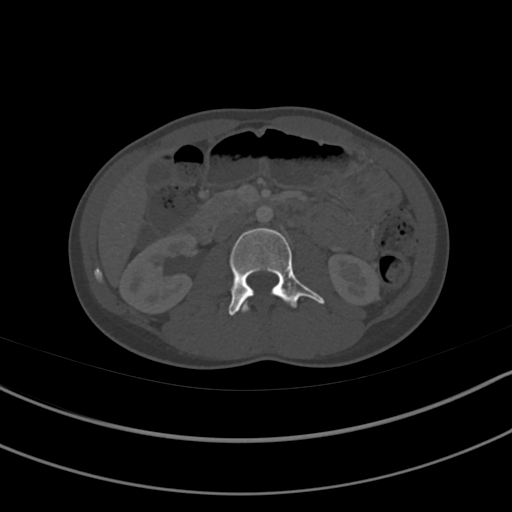
[im 171/244  soft-tissue]
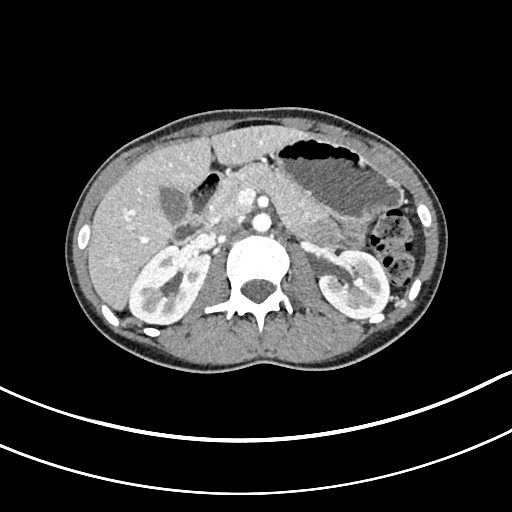
[im 195/244  soft-tissue]
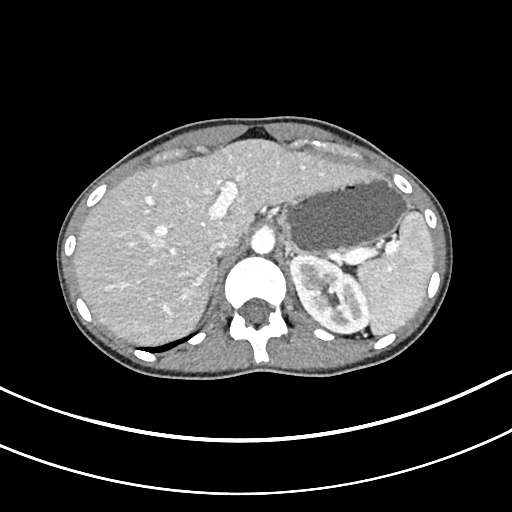
[im 207/244  soft-tissue]
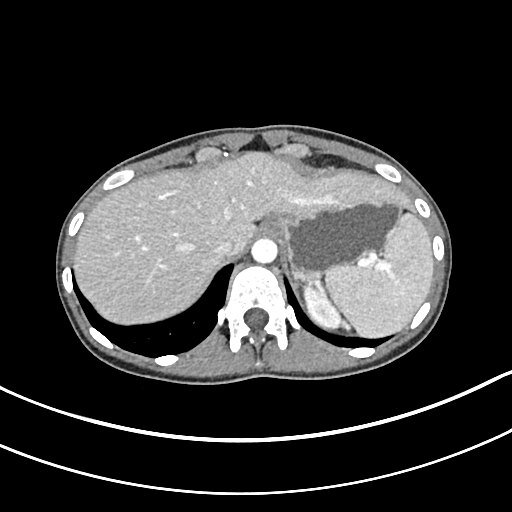
[im 231/244  soft-tissue]
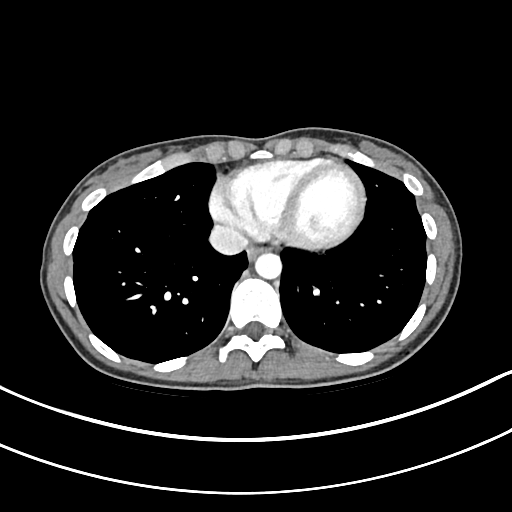

[16 of 46 positions shown; findings below may reference images not displayed]

FINDINGS: Lower Chest: No acute findings.

Hepatobiliary: No hepatic masses identified. Gallbladder is
unremarkable. No evidence of biliary ductal dilatation.

Pancreas:  No mass or inflammatory changes.

Spleen: Within normal limits in size and appearance.

Adrenals/Urinary Tract: No masses identified. No evidence of
ureteral calculi or hydronephrosis.

Stomach/Bowel: No evidence of bowel wall thickening, abnormal
contrast enhancement, or dilatation. No evidence of mesenteric
inflammatory changes, enteric fistula, or abnormal fluid
collections. The terminal ileum is normal in appearance.

Vascular/Lymphatic: No pathologically enlarged lymph nodes. No
abdominal aortic aneurysm.

Reproductive:  No mass or other significant abnormality.

Other:  None.

Musculoskeletal:  No suspicious bone lesions identified.
IMPRESSION: Negative. No radiographic evidence of inflammatory bowel disease or
other significant abnormality.

## 2021-12-16 ENCOUNTER — Other Ambulatory Visit (HOSPITAL_COMMUNITY): Payer: Self-pay

## 2022-02-02 ENCOUNTER — Other Ambulatory Visit (HOSPITAL_COMMUNITY): Payer: Self-pay

## 2022-02-06 ENCOUNTER — Other Ambulatory Visit (HOSPITAL_COMMUNITY): Payer: Self-pay

## 2022-02-08 ENCOUNTER — Other Ambulatory Visit (HOSPITAL_COMMUNITY): Payer: Self-pay

## 2022-03-06 ENCOUNTER — Other Ambulatory Visit (HOSPITAL_COMMUNITY): Payer: Self-pay

## 2022-03-07 ENCOUNTER — Other Ambulatory Visit (HOSPITAL_COMMUNITY): Payer: Self-pay

## 2022-03-07 MED ORDER — KETOCONAZOLE 2 % EX SHAM
MEDICATED_SHAMPOO | CUTANEOUS | 3 refills | Status: AC
  Filled 2022-03-07: qty 120, 30d supply, fill #0
  Filled 2022-04-05: qty 120, 30d supply, fill #1
  Filled 2023-02-03: qty 120, 30d supply, fill #2

## 2022-03-13 ENCOUNTER — Other Ambulatory Visit: Payer: Self-pay

## 2022-04-05 ENCOUNTER — Other Ambulatory Visit (HOSPITAL_COMMUNITY): Payer: Self-pay

## 2022-04-22 ENCOUNTER — Other Ambulatory Visit (HOSPITAL_COMMUNITY): Payer: Self-pay

## 2022-06-12 ENCOUNTER — Other Ambulatory Visit (HOSPITAL_COMMUNITY): Payer: Self-pay

## 2022-06-23 DIAGNOSIS — J302 Other seasonal allergic rhinitis: Secondary | ICD-10-CM | POA: Diagnosis not present

## 2022-06-23 DIAGNOSIS — H6983 Other specified disorders of Eustachian tube, bilateral: Secondary | ICD-10-CM | POA: Diagnosis not present

## 2022-06-26 ENCOUNTER — Other Ambulatory Visit (HOSPITAL_COMMUNITY): Payer: Self-pay

## 2022-06-26 DIAGNOSIS — L2084 Intrinsic (allergic) eczema: Secondary | ICD-10-CM | POA: Diagnosis not present

## 2022-06-26 DIAGNOSIS — L700 Acne vulgaris: Secondary | ICD-10-CM | POA: Diagnosis not present

## 2022-06-26 DIAGNOSIS — L219 Seborrheic dermatitis, unspecified: Secondary | ICD-10-CM | POA: Diagnosis not present

## 2022-06-26 MED ORDER — CLOBETASOL PROPIONATE 0.05 % EX SOLN
CUTANEOUS | 11 refills | Status: DC
  Filled 2022-06-26: qty 50, 30d supply, fill #0
  Filled 2023-05-20: qty 50, 30d supply, fill #1

## 2022-06-26 MED ORDER — KETOCONAZOLE 2 % EX SHAM
MEDICATED_SHAMPOO | CUTANEOUS | 11 refills | Status: DC
  Filled 2022-06-26: qty 120, 30d supply, fill #0
  Filled 2022-11-30: qty 120, 30d supply, fill #1
  Filled 2023-05-02: qty 120, 30d supply, fill #2
  Filled 2023-06-17: qty 120, 30d supply, fill #3

## 2022-06-26 MED ORDER — CLOBETASOL PROPIONATE 0.05 % EX CREA
TOPICAL_CREAM | CUTANEOUS | 3 refills | Status: AC
  Filled 2022-06-26: qty 60, 30d supply, fill #0

## 2022-06-26 MED ORDER — EUCRISA 2 % EX OINT
TOPICAL_OINTMENT | CUTANEOUS | 4 refills | Status: AC
  Filled 2022-06-26: qty 60, 30d supply, fill #0

## 2022-06-26 MED ORDER — TRIAMCINOLONE ACETONIDE 0.1 % EX CREA
TOPICAL_CREAM | CUTANEOUS | 1 refills | Status: DC
  Filled 2022-06-26: qty 454, 30d supply, fill #0

## 2022-06-27 ENCOUNTER — Other Ambulatory Visit (HOSPITAL_COMMUNITY): Payer: Self-pay

## 2022-07-30 ENCOUNTER — Other Ambulatory Visit (HOSPITAL_COMMUNITY): Payer: Self-pay

## 2022-07-31 ENCOUNTER — Other Ambulatory Visit (HOSPITAL_COMMUNITY): Payer: Self-pay

## 2022-07-31 MED ORDER — LEVOCETIRIZINE DIHYDROCHLORIDE 5 MG PO TABS
5.0000 mg | ORAL_TABLET | Freq: Every evening | ORAL | 3 refills | Status: DC
  Filled 2022-07-31: qty 90, 90d supply, fill #0
  Filled 2022-10-22: qty 90, 90d supply, fill #1
  Filled 2023-01-30 – 2023-02-03 (×2): qty 90, 90d supply, fill #2
  Filled 2023-05-02: qty 90, 90d supply, fill #3

## 2022-08-02 DIAGNOSIS — H6983 Other specified disorders of Eustachian tube, bilateral: Secondary | ICD-10-CM | POA: Diagnosis not present

## 2022-08-02 DIAGNOSIS — J302 Other seasonal allergic rhinitis: Secondary | ICD-10-CM | POA: Diagnosis not present

## 2022-08-04 ENCOUNTER — Other Ambulatory Visit (HOSPITAL_COMMUNITY): Payer: Self-pay

## 2022-08-27 ENCOUNTER — Other Ambulatory Visit (HOSPITAL_COMMUNITY): Payer: Self-pay

## 2022-08-28 ENCOUNTER — Other Ambulatory Visit (HOSPITAL_COMMUNITY): Payer: Self-pay

## 2022-08-28 DIAGNOSIS — Z23 Encounter for immunization: Secondary | ICD-10-CM | POA: Diagnosis not present

## 2022-08-28 DIAGNOSIS — Z0000 Encounter for general adult medical examination without abnormal findings: Secondary | ICD-10-CM | POA: Diagnosis not present

## 2022-08-28 MED ORDER — MONTELUKAST SODIUM 10 MG PO TABS
10.0000 mg | ORAL_TABLET | Freq: Every evening | ORAL | 1 refills | Status: DC
  Filled 2022-08-28 (×2): qty 90, 90d supply, fill #0
  Filled 2022-11-22: qty 90, 90d supply, fill #1

## 2022-08-29 ENCOUNTER — Other Ambulatory Visit (HOSPITAL_COMMUNITY): Payer: Self-pay

## 2022-08-29 DIAGNOSIS — L219 Seborrheic dermatitis, unspecified: Secondary | ICD-10-CM | POA: Diagnosis not present

## 2022-08-29 DIAGNOSIS — L2084 Intrinsic (allergic) eczema: Secondary | ICD-10-CM | POA: Diagnosis not present

## 2022-08-29 DIAGNOSIS — L700 Acne vulgaris: Secondary | ICD-10-CM | POA: Diagnosis not present

## 2022-08-29 MED ORDER — TRETINOIN 0.05 % EX CREA
TOPICAL_CREAM | CUTANEOUS | 4 refills | Status: AC
  Filled 2022-08-29: qty 45, 20d supply, fill #0

## 2022-08-29 MED ORDER — EUCRISA 2 % EX OINT
1.0000 | TOPICAL_OINTMENT | CUTANEOUS | 3 refills | Status: AC
  Filled 2022-08-29: qty 60, 30d supply, fill #0

## 2022-08-30 ENCOUNTER — Other Ambulatory Visit (HOSPITAL_COMMUNITY): Payer: Self-pay

## 2022-08-30 ENCOUNTER — Other Ambulatory Visit: Payer: Self-pay

## 2022-09-15 ENCOUNTER — Ambulatory Visit
Admission: EM | Admit: 2022-09-15 | Discharge: 2022-09-15 | Disposition: A | Discharge status: Home / Self Care | Payer: Commercial Managed Care - PPO

## 2022-09-15 ENCOUNTER — Ambulatory Visit
Admission: EM | Admit: 2022-09-15 | Discharge: 2022-09-15 | Discharge status: Against Medical Advice | Payer: Commercial Managed Care - PPO

## 2022-09-15 ENCOUNTER — Ambulatory Visit (INDEPENDENT_AMBULATORY_CARE_PROVIDER_SITE_OTHER): Payer: Commercial Managed Care - PPO

## 2022-09-15 DIAGNOSIS — J029 Acute pharyngitis, unspecified: Secondary | ICD-10-CM

## 2022-09-15 DIAGNOSIS — M25572 Pain in left ankle and joints of left foot: Secondary | ICD-10-CM | POA: Diagnosis not present

## 2022-09-15 DIAGNOSIS — J069 Acute upper respiratory infection, unspecified: Secondary | ICD-10-CM

## 2022-09-15 DIAGNOSIS — M79672 Pain in left foot: Secondary | ICD-10-CM

## 2022-09-15 DIAGNOSIS — S93402A Sprain of unspecified ligament of left ankle, initial encounter: Secondary | ICD-10-CM

## 2022-09-15 DIAGNOSIS — R051 Acute cough: Secondary | ICD-10-CM

## 2022-09-15 DIAGNOSIS — W19XXXA Unspecified fall, initial encounter: Secondary | ICD-10-CM | POA: Diagnosis not present

## 2022-09-15 DIAGNOSIS — Z043 Encounter for examination and observation following other accident: Secondary | ICD-10-CM | POA: Diagnosis not present

## 2022-09-15 NOTE — ED Provider Notes (Signed)
[facility]    CSN: [id number] Arrival date & time: 09/15/22  1643      History   Chief Complaint Chief Complaint  Patient presents with   Sore Throat   Generalized Body Aches    HPI April Patterson is a 22 y.o. female presenting for approximately 4-day history of ear pressure and fullness, body aches, cough, congestion and sore throat as well as fatigue.  Patient has been exposed to multiple small children who tested positive for RSV.  No known COVID or flu exposure and she had a negative COVID test at home.  She is not complaining of any sinus pain, chest pain, wheezing or shortness of breath.  No vomiting or diarrhea. Taking Mucinex.  Patient also reports left ankle and foot pain and swelling as well as bruising.  She reports a fall about a week and a half ago.  She says the swelling and discomfort have significantly improved.  She is able to walk and bear weight.  No numbness, weeks or tingling.  No report of any previous fractures.  HPI  Past Medical History:  Diagnosis Date   Allergic rhinitis 03/26/2014   Anemia    Chronic diarrhea    Chronic fatigue 01/23/2018   Eczema 03/26/2014   Family history of adverse reaction to anesthesia    Mother - needed Narcan after hysterectomy   Functional abdominal pain syndrome 08/23/2020   GERD (gastroesophageal reflux disease)    Loss of weight    Perforation of tympanic membrane due to otitis media 10/27/2020   Serous otitis media 10/27/2020   Sinusitis 10/27/2020   Upper respiratory tract infection 10/27/2020    Patient Active Problem List   Diagnosis Date Noted   Environmental allergies 08/23/2020   Mild intermittent asthma 08/23/2020    Past Surgical History:  Procedure Laterality Date   ADENOIDECTOMY     BIOPSY N/A 09/02/2020   Procedure: BIOPSY;  Surgeon: Lin Landsman, MD;  Location: [place];  Service: Endoscopy;  Laterality: N/A;   COLONOSCOPY WITH PROPOFOL N/A 09/02/2020   Procedure:  COLONOSCOPY WITH PROPOFOL;  Surgeon: Lin Landsman, MD;  Location: [place];  Service: Endoscopy;  Laterality: N/A;   DENTAL REHABILITATION     ESOPHAGOGASTRODUODENOSCOPY (EGD) WITH PROPOFOL N/A 09/02/2020   Procedure: ESOPHAGOGASTRODUODENOSCOPY (EGD) WITH PROPOFOL;  Surgeon: Lin Landsman, MD;  Location: [place];  Service: Endoscopy;  Laterality: N/A;   GIVENS CAPSULE STUDY N/A 10/04/2020   Procedure: GIVENS CAPSULE STUDY;  Surgeon: Lin Landsman, MD;  Location: [facility] ENDOSCOPY;  Service: Gastroenterology;  Laterality: N/A;   TYMPANOSTOMY TUBE PLACEMENT     WISDOM TOOTH EXTRACTION  2021    OB History   No obstetric history on file.      Home Medications    Prior to Admission medications   Medication Sig Start Date End Date Taking? Authorizing Provider  azelastine (ASTELIN) 0.1 % nasal spray Place 2 sprays into both nostrils 2 (two) times daily. 06/23/22  Yes [provider]  clobetasol (TEMOVATE) 0.05 % external solution Apply twice a day to red, itchy, scaly areas of the scalp until the skin feels smooth, then discontinue. 06/26/22  Yes   clobetasol cream (TEMOVATE) 0.05 % Apply twice a day to stubborn eczema flares of the skin until the skin feels smooth, then discontinue. Do not apply to the skin on the face, underarms or groin. 06/26/22  Yes   clobetasol ointment (TEMOVATE) 4.09 % Apply 1 application topically 2 (  two) times daily.   Yes [provider]  Crisaborole (EUCRISA) 2 % OINT Apply topically 2 times a day as needed 03/29/21  Yes   Crisaborole (EUCRISA) 2 % OINT Apply twice a day to the eczema flares on the face. 06/26/22  Yes   Crisaborole (EUCRISA) 2 % OINT Apply 1 application topically every morning. 08/29/22  Yes   Dermatological Products, Misc. (CERAMAX) CREA Apply topically to skin as needed 03/04/18  Yes [provider]  EUCRISA 2 % OINT  05/17/18  Yes [provider]  famotidine (PEPCID) 10 MG  tablet Take 10 mg by mouth 2 (two) times daily.   Yes [provider]  ferrous sulfate 325 (65 FE) MG tablet Take by mouth.   Yes [provider]  fluticasone (FLONASE) 50 MCG/ACT nasal spray Use 1 spray into each nostril 2 times a day. 08/23/21  Yes   ketoconazole (NIZORAL) 2 % shampoo APPLY TOPICALLY 2 TIMES A WEEK. SHAMPOO FACE, SCALP, AND EARS TWICE A WEEK.  LATHER AND LET SIT FOR 2-3 MINUTES BEFORE RINSING. 03/07/22  Yes   ketoconazole (NIZORAL) 2 % shampoo Apply to the scalp and leave on for 5-10 minutes, then wash off. Repeat 2-3 times a week. 06/26/22  Yes   Lactobacillus Rhamnosus, GG, (RA PROBIOTIC DIGESTIVE CARE) CAPS Take 1 capsule by mouth daily.   Yes [provider]  levocetirizine (XYZAL) 5 MG tablet Take 1 tablet by mouth every evening. 08/23/21  Yes   levocetirizine (XYZAL) 5 MG tablet Take 1 tablet (5 mg total) by mouth every evening. 07/31/22  Yes   loperamide (IMODIUM A-D) 2 MG tablet Take by mouth.   Yes [provider]  Melatonin 3 MG CAPS Take by mouth. 05/04/20  Yes [provider]  montelukast (SINGULAIR) 10 MG tablet Take 1 tablet (10 mg total) by mouth Nightly. 08/27/22  Yes   Multiple Vitamins-Minerals (MULTIVITAMIN GUMMIES ADULT PO) Take by mouth.   Yes [provider]  OVER THE COUNTER MEDICATION [place] [facility] - Magnesium, Vitamin B12, Riboflavin   Yes [provider]  OVER THE COUNTER MEDICATION [place]   Yes [provider]  tretinoin (RETIN-A) 0.05 % cream Wash face and wait at least 20 minutes.  Apply a pea size amount to the face except upper eyelids. Skip a few nights if you get too dry 08/29/22  Yes   triamcinolone cream (KENALOG) 0.1 % Apply twice a day to mild - moderate eczema flares of the skin until the skin feels smooth, then discontinue. Do not apply to the skin on the face, underarms or groin. 06/26/22  Yes   COVID-19 At Home Antigen Test [facility] COVID-19 HOME TEST) KIT use  as directed 06/09/21   Letta Median, RPH  levocetirizine (XYZAL) 5 MG tablet TAKE 1 TABLET BY MOUTH EVERY EVENING. 08/20/20 08/20/21  Verita Lamb, NP    Family History History reviewed. No pertinent family history.  Social History Social History   Tobacco Use   Smoking status: Never   Smokeless tobacco: Never  Vaping Use   Vaping Use: Never used  Substance Use Topics   Alcohol use: Not Currently   Drug use: Never     Allergies   Prednisone and Wheat bran   Review of Systems Review of Systems  Constitutional:  Positive for fatigue. Negative for chills, diaphoresis and fever.  HENT:  Positive for congestion, ear pain, postnasal drip, rhinorrhea and sore throat. Negative for sinus pressure and sinus pain.  Respiratory:  Positive for cough. Negative for shortness of breath.   Gastrointestinal:  Negative for abdominal pain, nausea and vomiting.  Musculoskeletal:  Positive for arthralgias and joint swelling. Negative for myalgias.  Skin:  Negative for rash.  Neurological:  Negative for weakness and headaches.  Hematological:  Negative for adenopathy.     Physical Exam Triage Vital Signs ED Triage Vitals  Enc Vitals Group     BP      Pulse      Resp      Temp      Temp src      SpO2      Weight      Height      Head Circumference      Peak Flow      Pain Score      Pain Loc      Pain Edu?      Excl. in [place]?    No data found.  Updated Vital Signs BP 110/86 (BP Location: Left Arm)   Pulse 72   Temp 97.8 F (36.6 C) (Oral)   Resp 18   Ht '5\' 6"'$  (1.676 m)   Wt 112 lb (50.8 kg)   LMP 09/11/2022   SpO2 100%   BMI 18.08 kg/m   Physical Exam Vitals and nursing note reviewed.  Constitutional:      General: She is not in acute distress.    Appearance: Normal appearance. She is well-developed. She is not ill-appearing or toxic-appearing.  HENT:     Head: Normocephalic and atraumatic.     Right Ear: Tympanic membrane, ear canal and external ear  normal.     Left Ear: Tympanic membrane, ear canal and external ear normal.     Nose: Congestion present.     Mouth/Throat:     Mouth: Mucous membranes are moist.     Pharynx: Oropharynx is clear. Posterior oropharyngeal erythema present.  Eyes:     General: No scleral icterus.       Right eye: No discharge.        Left eye: No discharge.     Conjunctiva/sclera: Conjunctivae normal.  Cardiovascular:     Rate and Rhythm: Normal rate and regular rhythm.     Heart sounds: Normal heart sounds.  Pulmonary:     Effort: Pulmonary effort is normal. No respiratory distress.     Breath sounds: Normal breath sounds.  Musculoskeletal:     Cervical back: Neck supple.     Left ankle: Swelling (mild lateral ankle) present. Tenderness present over the lateral malleolus, ATF ligament and base of 5th metatarsal. Normal range of motion.     Comments: Faint resolving ecchymosis lateral ankle  Skin:    General: Skin is dry.  Neurological:     General: No focal deficit present.     Mental Status: She is alert. Mental status is at baseline.     Motor: No weakness.     Gait: Gait normal.  Psychiatric:        Mood and Affect: Mood normal.        Behavior: Behavior normal.        Thought Content: Thought content normal.      UC Treatments / Results  Labs (all labs ordered are listed, but only abnormal results are displayed) Labs Reviewed - No data to display   EKG   Radiology DG Foot Complete Left  Result Date: 09/15/2022 CLINICAL DATA:  Fall EXAM: LEFT FOOT - COMPLETE 3+ VIEW COMPARISON:  None Available. FINDINGS: There is no evidence of fracture or dislocation. There is no evidence of arthropathy or other focal bone abnormality. Soft tissues are unremarkable. IMPRESSION: Negative. Electronically Signed   By: Lucrezia Europe M.D.   On: 09/15/2022 17:34   DG Ankle Complete Left  Result Date: 09/15/2022 CLINICAL DATA:  Fall EXAM: LEFT ANKLE COMPLETE - 3+ VIEW COMPARISON:  07/28/2009 FINDINGS: There  is no evidence of fracture, dislocation, or joint effusion. There is no evidence of arthropathy or other focal bone abnormality. Interval growth. Soft tissues are unremarkable. IMPRESSION: Negative. Electronically Signed   By: Lucrezia Europe M.D.   On: 09/15/2022 17:33    Procedures Procedures (including critical care time)  Medications Ordered in UC Medications - No data to display  Initial Impression / Assessment and Plan / UC Course  I have reviewed the triage vital signs and the nursing notes.  Pertinent labs & imaging results that were available during my care of the patient were reviewed by me and considered in my medical decision making (see chart for details).   22 year old female presents for 40 history of cough, ingestion, sore throat, ear fullness.  Exposed to RSV.  Negative home COVID test.  Also reports left ankle pain and swelling as well as bruising for the past week and a half after fall.  Vitals normal and stable and she is overall well-appearing.  On exam she has nasal congestion and mild posterior pharyngeal erythema.  Chest clear auscultation heart regular rate and rhythm.  She has slight swelling of the lateral ankle and tenderness palpation of the lateral malleolus and ATFL as well as base of fifth metatarsal.  X-ray of foot and ankle obtained today shows no acute fractures.  Discussed result with patient and parent.  Advised patient she has symptoms consistent with viral URI and an ankle sprain.  Suggested over-the-counter DayQuil/NyQuil and Mucinex, rest and fluids and she should be feeling better within a week or so.  Reviewed return precautions.  Reviewed RICE guidelines for the ankle sprain.  She has a ankle brace that she is going to use at home.  We discussed ibuprofen and Tylenol as needed for pain relief.  Follow-up as needed.   Final Clinical Impressions(s) / UC Diagnoses   Final diagnoses:  Viral upper respiratory tract infection  Sore throat  Acute cough   Sprain of left ankle, unspecified ligament, initial encounter     Discharge Instructions      URI/COLD SYMPTOMS: Your exam today is consistent with a viral illness. Antibiotics are not indicated at this time. Use medications as directed, including cough syrup, nasal saline, and decongestants. Your symptoms should improve over the next few days and resolve within 7-10 days. Increase rest and fluids. F/u if symptoms worsen or predominate such as sore throat, ear pain, productive cough, shortness of breath, or if you develop high fevers or worsening fatigue over the next several days.    SPRAIN: Stressed avoiding painful activities . Reviewed RICE guidelines. Use medications as directed, including NSAIDs. If no NSAIDs have been prescribed for you today, you may take Aleve or Motrin over the counter. May use Tylenol in between doses of NSAIDs.  If no improvement in the next 1-2 weeks, f/u with PCP or return to our office for reexamination, and please feel free to call or return at any time for any questions or concerns you may have and we will be happy to help you!         ED  Prescriptions   None    PDMP not reviewed this encounter.   Danton Clap, PA-C 09/15/22 1820

## 2022-09-15 NOTE — ED Triage Notes (Signed)
Pt c/o sore throat, drainage, ear fullness, body aches, fatigue x4days  Pt was at her aunts house for christmas and they had RSV.   Pt was given astelin by her ENT   Pt fell off of her driveway and into a ditch and has bruising and pain along her left ankle and foot.

## 2022-09-15 NOTE — Discharge Instructions (Signed)
URI/COLD SYMPTOMS: Your exam today is consistent with a viral illness. Antibiotics are not indicated at this time. Use medications as directed, including cough syrup, nasal saline, and decongestants. Your symptoms should improve over the next few days and resolve within 7-10 days. Increase rest and fluids. F/u if symptoms worsen or predominate such as sore throat, ear pain, productive cough, shortness of breath, or if you develop high fevers or worsening fatigue over the next several days.    SPRAIN: Stressed avoiding painful activities . Reviewed RICE guidelines. Use medications as directed, including NSAIDs. If no NSAIDs have been prescribed for you today, you may take Aleve or Motrin over the counter. May use Tylenol in between doses of NSAIDs.  If no improvement in the next 1-2 weeks, f/u with PCP or return to our office for reexamination, and please feel free to call or return at any time for any questions or concerns you may have and we will be happy to help you!

## 2022-10-06 ENCOUNTER — Other Ambulatory Visit: Payer: Self-pay

## 2022-10-22 ENCOUNTER — Other Ambulatory Visit: Payer: Self-pay

## 2022-11-22 ENCOUNTER — Other Ambulatory Visit: Payer: Self-pay

## 2022-12-01 ENCOUNTER — Other Ambulatory Visit: Payer: Self-pay

## 2023-01-31 ENCOUNTER — Encounter: Payer: Self-pay | Admitting: Pharmacist

## 2023-01-31 ENCOUNTER — Other Ambulatory Visit (HOSPITAL_COMMUNITY): Payer: Self-pay

## 2023-01-31 ENCOUNTER — Other Ambulatory Visit: Payer: Self-pay

## 2023-02-02 ENCOUNTER — Other Ambulatory Visit: Payer: Self-pay

## 2023-02-03 ENCOUNTER — Other Ambulatory Visit (HOSPITAL_COMMUNITY): Payer: Self-pay

## 2023-02-06 ENCOUNTER — Other Ambulatory Visit: Payer: Self-pay

## 2023-02-06 ENCOUNTER — Other Ambulatory Visit (HOSPITAL_COMMUNITY): Payer: Self-pay

## 2023-02-06 MED ORDER — MONTELUKAST SODIUM 10 MG PO TABS
10.0000 mg | ORAL_TABLET | Freq: Every evening | ORAL | 1 refills | Status: DC
  Filled 2023-03-18 – 2023-03-23 (×2): qty 90, 90d supply, fill #0
  Filled 2023-06-17: qty 90, 90d supply, fill #1

## 2023-02-26 DIAGNOSIS — M9905 Segmental and somatic dysfunction of pelvic region: Secondary | ICD-10-CM | POA: Diagnosis not present

## 2023-02-26 DIAGNOSIS — M5416 Radiculopathy, lumbar region: Secondary | ICD-10-CM | POA: Diagnosis not present

## 2023-02-26 DIAGNOSIS — M5417 Radiculopathy, lumbosacral region: Secondary | ICD-10-CM | POA: Diagnosis not present

## 2023-02-26 DIAGNOSIS — M9903 Segmental and somatic dysfunction of lumbar region: Secondary | ICD-10-CM | POA: Diagnosis not present

## 2023-02-27 DIAGNOSIS — M9903 Segmental and somatic dysfunction of lumbar region: Secondary | ICD-10-CM | POA: Diagnosis not present

## 2023-02-27 DIAGNOSIS — M5416 Radiculopathy, lumbar region: Secondary | ICD-10-CM | POA: Diagnosis not present

## 2023-02-27 DIAGNOSIS — M5417 Radiculopathy, lumbosacral region: Secondary | ICD-10-CM | POA: Diagnosis not present

## 2023-02-27 DIAGNOSIS — M9905 Segmental and somatic dysfunction of pelvic region: Secondary | ICD-10-CM | POA: Diagnosis not present

## 2023-02-28 DIAGNOSIS — M9903 Segmental and somatic dysfunction of lumbar region: Secondary | ICD-10-CM | POA: Diagnosis not present

## 2023-02-28 DIAGNOSIS — M9905 Segmental and somatic dysfunction of pelvic region: Secondary | ICD-10-CM | POA: Diagnosis not present

## 2023-02-28 DIAGNOSIS — M5417 Radiculopathy, lumbosacral region: Secondary | ICD-10-CM | POA: Diagnosis not present

## 2023-02-28 DIAGNOSIS — M5416 Radiculopathy, lumbar region: Secondary | ICD-10-CM | POA: Diagnosis not present

## 2023-03-01 DIAGNOSIS — M5417 Radiculopathy, lumbosacral region: Secondary | ICD-10-CM | POA: Diagnosis not present

## 2023-03-01 DIAGNOSIS — M9905 Segmental and somatic dysfunction of pelvic region: Secondary | ICD-10-CM | POA: Diagnosis not present

## 2023-03-01 DIAGNOSIS — M5416 Radiculopathy, lumbar region: Secondary | ICD-10-CM | POA: Diagnosis not present

## 2023-03-01 DIAGNOSIS — M9903 Segmental and somatic dysfunction of lumbar region: Secondary | ICD-10-CM | POA: Diagnosis not present

## 2023-03-19 ENCOUNTER — Other Ambulatory Visit: Payer: Self-pay

## 2023-03-22 ENCOUNTER — Other Ambulatory Visit: Payer: Self-pay

## 2023-03-23 ENCOUNTER — Other Ambulatory Visit: Payer: Self-pay

## 2023-03-23 ENCOUNTER — Other Ambulatory Visit (HOSPITAL_COMMUNITY): Payer: Self-pay

## 2023-05-03 ENCOUNTER — Other Ambulatory Visit (HOSPITAL_COMMUNITY): Payer: Self-pay

## 2023-05-21 ENCOUNTER — Other Ambulatory Visit (HOSPITAL_COMMUNITY): Payer: Self-pay

## 2023-06-07 DIAGNOSIS — M9902 Segmental and somatic dysfunction of thoracic region: Secondary | ICD-10-CM | POA: Diagnosis not present

## 2023-06-07 DIAGNOSIS — M9903 Segmental and somatic dysfunction of lumbar region: Secondary | ICD-10-CM | POA: Diagnosis not present

## 2023-06-07 DIAGNOSIS — M546 Pain in thoracic spine: Secondary | ICD-10-CM | POA: Diagnosis not present

## 2023-06-07 DIAGNOSIS — M5459 Other low back pain: Secondary | ICD-10-CM | POA: Diagnosis not present

## 2023-06-12 DIAGNOSIS — M546 Pain in thoracic spine: Secondary | ICD-10-CM | POA: Diagnosis not present

## 2023-06-12 DIAGNOSIS — M5459 Other low back pain: Secondary | ICD-10-CM | POA: Diagnosis not present

## 2023-06-12 DIAGNOSIS — M9903 Segmental and somatic dysfunction of lumbar region: Secondary | ICD-10-CM | POA: Diagnosis not present

## 2023-06-12 DIAGNOSIS — M9902 Segmental and somatic dysfunction of thoracic region: Secondary | ICD-10-CM | POA: Diagnosis not present

## 2023-06-17 ENCOUNTER — Other Ambulatory Visit (HOSPITAL_COMMUNITY): Payer: Self-pay

## 2023-06-25 DIAGNOSIS — H6983 Other specified disorders of Eustachian tube, bilateral: Secondary | ICD-10-CM | POA: Diagnosis not present

## 2023-06-25 DIAGNOSIS — J328 Other chronic sinusitis: Secondary | ICD-10-CM | POA: Diagnosis not present

## 2023-06-25 DIAGNOSIS — J302 Other seasonal allergic rhinitis: Secondary | ICD-10-CM | POA: Diagnosis not present

## 2023-06-26 DIAGNOSIS — M542 Cervicalgia: Secondary | ICD-10-CM | POA: Diagnosis not present

## 2023-06-26 DIAGNOSIS — M9903 Segmental and somatic dysfunction of lumbar region: Secondary | ICD-10-CM | POA: Diagnosis not present

## 2023-06-26 DIAGNOSIS — M5459 Other low back pain: Secondary | ICD-10-CM | POA: Diagnosis not present

## 2023-06-26 DIAGNOSIS — M546 Pain in thoracic spine: Secondary | ICD-10-CM | POA: Diagnosis not present

## 2023-06-26 DIAGNOSIS — J301 Allergic rhinitis due to pollen: Secondary | ICD-10-CM | POA: Diagnosis not present

## 2023-06-26 DIAGNOSIS — M9902 Segmental and somatic dysfunction of thoracic region: Secondary | ICD-10-CM | POA: Diagnosis not present

## 2023-07-03 DIAGNOSIS — M542 Cervicalgia: Secondary | ICD-10-CM | POA: Diagnosis not present

## 2023-07-03 DIAGNOSIS — M546 Pain in thoracic spine: Secondary | ICD-10-CM | POA: Diagnosis not present

## 2023-07-03 DIAGNOSIS — M9902 Segmental and somatic dysfunction of thoracic region: Secondary | ICD-10-CM | POA: Diagnosis not present

## 2023-07-03 DIAGNOSIS — M5459 Other low back pain: Secondary | ICD-10-CM | POA: Diagnosis not present

## 2023-07-03 DIAGNOSIS — M9903 Segmental and somatic dysfunction of lumbar region: Secondary | ICD-10-CM | POA: Diagnosis not present

## 2023-07-04 DIAGNOSIS — J301 Allergic rhinitis due to pollen: Secondary | ICD-10-CM | POA: Diagnosis not present

## 2023-07-10 DIAGNOSIS — M9903 Segmental and somatic dysfunction of lumbar region: Secondary | ICD-10-CM | POA: Diagnosis not present

## 2023-07-10 DIAGNOSIS — M546 Pain in thoracic spine: Secondary | ICD-10-CM | POA: Diagnosis not present

## 2023-07-10 DIAGNOSIS — M5459 Other low back pain: Secondary | ICD-10-CM | POA: Diagnosis not present

## 2023-07-10 DIAGNOSIS — M9902 Segmental and somatic dysfunction of thoracic region: Secondary | ICD-10-CM | POA: Diagnosis not present

## 2023-07-19 DIAGNOSIS — M546 Pain in thoracic spine: Secondary | ICD-10-CM | POA: Diagnosis not present

## 2023-07-19 DIAGNOSIS — M9902 Segmental and somatic dysfunction of thoracic region: Secondary | ICD-10-CM | POA: Diagnosis not present

## 2023-07-19 DIAGNOSIS — M9903 Segmental and somatic dysfunction of lumbar region: Secondary | ICD-10-CM | POA: Diagnosis not present

## 2023-07-19 DIAGNOSIS — M5459 Other low back pain: Secondary | ICD-10-CM | POA: Diagnosis not present

## 2023-07-29 ENCOUNTER — Other Ambulatory Visit (HOSPITAL_COMMUNITY): Payer: Self-pay

## 2023-07-30 ENCOUNTER — Other Ambulatory Visit (HOSPITAL_COMMUNITY): Payer: Self-pay

## 2023-07-30 MED ORDER — LEVOCETIRIZINE DIHYDROCHLORIDE 5 MG PO TABS
5.0000 mg | ORAL_TABLET | Freq: Every evening | ORAL | 0 refills | Status: DC
  Filled 2023-07-30: qty 90, 90d supply, fill #0

## 2023-07-31 DIAGNOSIS — M9902 Segmental and somatic dysfunction of thoracic region: Secondary | ICD-10-CM | POA: Diagnosis not present

## 2023-07-31 DIAGNOSIS — M546 Pain in thoracic spine: Secondary | ICD-10-CM | POA: Diagnosis not present

## 2023-07-31 DIAGNOSIS — M9903 Segmental and somatic dysfunction of lumbar region: Secondary | ICD-10-CM | POA: Diagnosis not present

## 2023-07-31 DIAGNOSIS — M5459 Other low back pain: Secondary | ICD-10-CM | POA: Diagnosis not present

## 2023-08-16 DIAGNOSIS — M9902 Segmental and somatic dysfunction of thoracic region: Secondary | ICD-10-CM | POA: Diagnosis not present

## 2023-08-16 DIAGNOSIS — M546 Pain in thoracic spine: Secondary | ICD-10-CM | POA: Diagnosis not present

## 2023-08-16 DIAGNOSIS — M5459 Other low back pain: Secondary | ICD-10-CM | POA: Diagnosis not present

## 2023-08-16 DIAGNOSIS — M9903 Segmental and somatic dysfunction of lumbar region: Secondary | ICD-10-CM | POA: Diagnosis not present

## 2023-08-25 ENCOUNTER — Other Ambulatory Visit (HOSPITAL_COMMUNITY): Payer: Self-pay

## 2023-08-27 ENCOUNTER — Other Ambulatory Visit: Payer: Self-pay

## 2023-08-27 ENCOUNTER — Other Ambulatory Visit (HOSPITAL_COMMUNITY): Payer: Self-pay

## 2023-08-27 DIAGNOSIS — J301 Allergic rhinitis due to pollen: Secondary | ICD-10-CM | POA: Diagnosis not present

## 2023-08-27 MED ORDER — KETOCONAZOLE 2 % EX SHAM
1.0000 | MEDICATED_SHAMPOO | CUTANEOUS | 11 refills | Status: DC
  Filled 2023-08-27: qty 120, 56d supply, fill #0
  Filled 2024-08-25: qty 120, 56d supply, fill #1

## 2023-08-27 MED ORDER — CLOBETASOL PROPIONATE 0.05 % EX SOLN
1.0000 | Freq: Two times a day (BID) | CUTANEOUS | 11 refills | Status: DC
  Filled 2023-08-27: qty 50, 25d supply, fill #0
  Filled 2023-10-28: qty 50, 25d supply, fill #1
  Filled 2024-04-08: qty 50, 25d supply, fill #2
  Filled 2024-04-29: qty 50, 25d supply, fill #3
  Filled 2024-05-30: qty 50, 25d supply, fill #4
  Filled 2024-08-25: qty 50, 25d supply, fill #5

## 2023-08-27 MED ORDER — TRIAMCINOLONE ACETONIDE 0.1 % EX CREA
1.0000 | TOPICAL_CREAM | Freq: Two times a day (BID) | CUTANEOUS | 1 refills | Status: DC
  Filled 2023-08-27: qty 454, 90d supply, fill #0
  Filled 2024-08-25: qty 454, 90d supply, fill #1

## 2023-08-30 DIAGNOSIS — J301 Allergic rhinitis due to pollen: Secondary | ICD-10-CM | POA: Diagnosis not present

## 2023-08-31 ENCOUNTER — Other Ambulatory Visit (HOSPITAL_COMMUNITY): Payer: Self-pay

## 2023-08-31 DIAGNOSIS — E559 Vitamin D deficiency, unspecified: Secondary | ICD-10-CM | POA: Diagnosis not present

## 2023-08-31 DIAGNOSIS — Z0000 Encounter for general adult medical examination without abnormal findings: Secondary | ICD-10-CM | POA: Diagnosis not present

## 2023-08-31 DIAGNOSIS — Z23 Encounter for immunization: Secondary | ICD-10-CM | POA: Diagnosis not present

## 2023-08-31 DIAGNOSIS — E611 Iron deficiency: Secondary | ICD-10-CM | POA: Diagnosis not present

## 2023-08-31 DIAGNOSIS — N946 Dysmenorrhea, unspecified: Secondary | ICD-10-CM | POA: Diagnosis not present

## 2023-08-31 MED ORDER — TRANEXAMIC ACID 650 MG PO TABS
ORAL_TABLET | ORAL | 0 refills | Status: AC
  Filled 2023-08-31: qty 60, 30d supply, fill #0

## 2023-09-03 DIAGNOSIS — J301 Allergic rhinitis due to pollen: Secondary | ICD-10-CM | POA: Diagnosis not present

## 2023-09-06 DIAGNOSIS — J301 Allergic rhinitis due to pollen: Secondary | ICD-10-CM | POA: Diagnosis not present

## 2023-09-10 ENCOUNTER — Other Ambulatory Visit (HOSPITAL_COMMUNITY): Payer: Self-pay

## 2023-09-10 DIAGNOSIS — J301 Allergic rhinitis due to pollen: Secondary | ICD-10-CM | POA: Diagnosis not present

## 2023-09-11 ENCOUNTER — Other Ambulatory Visit (HOSPITAL_COMMUNITY): Payer: Self-pay

## 2023-09-11 MED ORDER — MONTELUKAST SODIUM 10 MG PO TABS
10.0000 mg | ORAL_TABLET | Freq: Every evening | ORAL | 3 refills | Status: DC
  Filled 2023-09-11: qty 90, 90d supply, fill #0
  Filled 2023-11-18 – 2023-11-22 (×2): qty 90, 90d supply, fill #1
  Filled 2024-03-04: qty 90, 90d supply, fill #2
  Filled 2024-06-02: qty 90, 90d supply, fill #3

## 2023-09-13 DIAGNOSIS — J301 Allergic rhinitis due to pollen: Secondary | ICD-10-CM | POA: Diagnosis not present

## 2023-09-22 ENCOUNTER — Other Ambulatory Visit (HOSPITAL_COMMUNITY): Payer: Self-pay

## 2023-09-25 DIAGNOSIS — M5459 Other low back pain: Secondary | ICD-10-CM | POA: Diagnosis not present

## 2023-09-25 DIAGNOSIS — M9903 Segmental and somatic dysfunction of lumbar region: Secondary | ICD-10-CM | POA: Diagnosis not present

## 2023-09-25 DIAGNOSIS — M546 Pain in thoracic spine: Secondary | ICD-10-CM | POA: Diagnosis not present

## 2023-09-25 DIAGNOSIS — M9902 Segmental and somatic dysfunction of thoracic region: Secondary | ICD-10-CM | POA: Diagnosis not present

## 2023-10-03 ENCOUNTER — Other Ambulatory Visit (HOSPITAL_COMMUNITY): Payer: Self-pay

## 2023-10-15 ENCOUNTER — Other Ambulatory Visit (HOSPITAL_COMMUNITY): Payer: Self-pay

## 2023-10-16 ENCOUNTER — Other Ambulatory Visit (HOSPITAL_COMMUNITY): Payer: Self-pay

## 2023-10-16 DIAGNOSIS — M9903 Segmental and somatic dysfunction of lumbar region: Secondary | ICD-10-CM | POA: Diagnosis not present

## 2023-10-16 DIAGNOSIS — M5459 Other low back pain: Secondary | ICD-10-CM | POA: Diagnosis not present

## 2023-10-16 DIAGNOSIS — M546 Pain in thoracic spine: Secondary | ICD-10-CM | POA: Diagnosis not present

## 2023-10-16 DIAGNOSIS — M9902 Segmental and somatic dysfunction of thoracic region: Secondary | ICD-10-CM | POA: Diagnosis not present

## 2023-10-16 MED ORDER — LEVOCETIRIZINE DIHYDROCHLORIDE 5 MG PO TABS
5.0000 mg | ORAL_TABLET | Freq: Every evening | ORAL | 0 refills | Status: DC
  Filled 2023-10-16 (×2): qty 90, 90d supply, fill #0

## 2023-10-16 MED ORDER — FLUTICASONE PROPIONATE 50 MCG/ACT NA SUSP
NASAL | 3 refills | Status: DC
  Filled 2023-10-16 (×2): qty 48, 90d supply, fill #0
  Filled 2024-04-08: qty 48, 90d supply, fill #1
  Filled 2024-04-21 – 2024-07-02 (×2): qty 48, 90d supply, fill #2
  Filled 2024-08-25 – 2024-09-12 (×4): qty 48, 90d supply, fill #3

## 2023-10-24 DIAGNOSIS — J301 Allergic rhinitis due to pollen: Secondary | ICD-10-CM | POA: Diagnosis not present

## 2023-10-29 ENCOUNTER — Other Ambulatory Visit: Payer: Self-pay

## 2023-11-01 DIAGNOSIS — M9903 Segmental and somatic dysfunction of lumbar region: Secondary | ICD-10-CM | POA: Diagnosis not present

## 2023-11-01 DIAGNOSIS — M546 Pain in thoracic spine: Secondary | ICD-10-CM | POA: Diagnosis not present

## 2023-11-01 DIAGNOSIS — M9902 Segmental and somatic dysfunction of thoracic region: Secondary | ICD-10-CM | POA: Diagnosis not present

## 2023-11-01 DIAGNOSIS — M5459 Other low back pain: Secondary | ICD-10-CM | POA: Diagnosis not present

## 2023-11-19 ENCOUNTER — Other Ambulatory Visit (HOSPITAL_COMMUNITY): Payer: Self-pay

## 2023-11-20 ENCOUNTER — Other Ambulatory Visit (HOSPITAL_COMMUNITY): Payer: Self-pay

## 2023-11-27 DIAGNOSIS — M9902 Segmental and somatic dysfunction of thoracic region: Secondary | ICD-10-CM | POA: Diagnosis not present

## 2023-11-27 DIAGNOSIS — M546 Pain in thoracic spine: Secondary | ICD-10-CM | POA: Diagnosis not present

## 2023-11-27 DIAGNOSIS — M5459 Other low back pain: Secondary | ICD-10-CM | POA: Diagnosis not present

## 2023-11-27 DIAGNOSIS — M9903 Segmental and somatic dysfunction of lumbar region: Secondary | ICD-10-CM | POA: Diagnosis not present

## 2023-12-03 DIAGNOSIS — J301 Allergic rhinitis due to pollen: Secondary | ICD-10-CM | POA: Diagnosis not present

## 2024-01-15 DIAGNOSIS — M5459 Other low back pain: Secondary | ICD-10-CM | POA: Diagnosis not present

## 2024-01-15 DIAGNOSIS — M542 Cervicalgia: Secondary | ICD-10-CM | POA: Diagnosis not present

## 2024-01-15 DIAGNOSIS — M9903 Segmental and somatic dysfunction of lumbar region: Secondary | ICD-10-CM | POA: Diagnosis not present

## 2024-01-15 DIAGNOSIS — M546 Pain in thoracic spine: Secondary | ICD-10-CM | POA: Diagnosis not present

## 2024-01-15 DIAGNOSIS — M9902 Segmental and somatic dysfunction of thoracic region: Secondary | ICD-10-CM | POA: Diagnosis not present

## 2024-01-23 ENCOUNTER — Other Ambulatory Visit (HOSPITAL_COMMUNITY): Payer: Self-pay

## 2024-01-23 ENCOUNTER — Other Ambulatory Visit: Payer: Self-pay

## 2024-01-23 MED ORDER — LEVOCETIRIZINE DIHYDROCHLORIDE 5 MG PO TABS
5.0000 mg | ORAL_TABLET | Freq: Every evening | ORAL | 2 refills | Status: DC
  Filled 2024-01-23: qty 90, 90d supply, fill #0
  Filled 2024-04-21: qty 90, 90d supply, fill #1
  Filled 2024-07-23: qty 90, 90d supply, fill #2

## 2024-01-24 DIAGNOSIS — J301 Allergic rhinitis due to pollen: Secondary | ICD-10-CM | POA: Diagnosis not present

## 2024-01-25 DIAGNOSIS — J301 Allergic rhinitis due to pollen: Secondary | ICD-10-CM | POA: Diagnosis not present

## 2024-02-05 DIAGNOSIS — J301 Allergic rhinitis due to pollen: Secondary | ICD-10-CM | POA: Diagnosis not present

## 2024-02-12 DIAGNOSIS — J301 Allergic rhinitis due to pollen: Secondary | ICD-10-CM | POA: Diagnosis not present

## 2024-02-18 DIAGNOSIS — J019 Acute sinusitis, unspecified: Secondary | ICD-10-CM | POA: Diagnosis not present

## 2024-02-18 DIAGNOSIS — H6983 Other specified disorders of Eustachian tube, bilateral: Secondary | ICD-10-CM | POA: Diagnosis not present

## 2024-02-26 DIAGNOSIS — M5417 Radiculopathy, lumbosacral region: Secondary | ICD-10-CM | POA: Diagnosis not present

## 2024-02-26 DIAGNOSIS — M9903 Segmental and somatic dysfunction of lumbar region: Secondary | ICD-10-CM | POA: Diagnosis not present

## 2024-02-26 DIAGNOSIS — M9905 Segmental and somatic dysfunction of pelvic region: Secondary | ICD-10-CM | POA: Diagnosis not present

## 2024-02-26 DIAGNOSIS — J301 Allergic rhinitis due to pollen: Secondary | ICD-10-CM | POA: Diagnosis not present

## 2024-02-26 DIAGNOSIS — M5416 Radiculopathy, lumbar region: Secondary | ICD-10-CM | POA: Diagnosis not present

## 2024-03-04 ENCOUNTER — Other Ambulatory Visit (HOSPITAL_COMMUNITY): Payer: Self-pay

## 2024-03-11 DIAGNOSIS — J301 Allergic rhinitis due to pollen: Secondary | ICD-10-CM | POA: Diagnosis not present

## 2024-03-18 DIAGNOSIS — J301 Allergic rhinitis due to pollen: Secondary | ICD-10-CM | POA: Diagnosis not present

## 2024-03-19 DIAGNOSIS — J301 Allergic rhinitis due to pollen: Secondary | ICD-10-CM | POA: Diagnosis not present

## 2024-03-20 DIAGNOSIS — K582 Mixed irritable bowel syndrome: Secondary | ICD-10-CM | POA: Diagnosis not present

## 2024-03-20 DIAGNOSIS — R109 Unspecified abdominal pain: Secondary | ICD-10-CM | POA: Diagnosis not present

## 2024-03-20 DIAGNOSIS — N946 Dysmenorrhea, unspecified: Secondary | ICD-10-CM | POA: Diagnosis not present

## 2024-03-27 ENCOUNTER — Other Ambulatory Visit (HOSPITAL_BASED_OUTPATIENT_CLINIC_OR_DEPARTMENT_OTHER): Payer: Self-pay

## 2024-04-01 DIAGNOSIS — J301 Allergic rhinitis due to pollen: Secondary | ICD-10-CM | POA: Diagnosis not present

## 2024-04-08 ENCOUNTER — Other Ambulatory Visit (HOSPITAL_COMMUNITY): Payer: Self-pay

## 2024-04-11 DIAGNOSIS — J301 Allergic rhinitis due to pollen: Secondary | ICD-10-CM | POA: Diagnosis not present

## 2024-04-14 ENCOUNTER — Other Ambulatory Visit (HOSPITAL_COMMUNITY): Payer: Self-pay

## 2024-04-18 DIAGNOSIS — J301 Allergic rhinitis due to pollen: Secondary | ICD-10-CM | POA: Diagnosis not present

## 2024-04-21 ENCOUNTER — Other Ambulatory Visit (HOSPITAL_COMMUNITY): Payer: Self-pay

## 2024-04-24 ENCOUNTER — Other Ambulatory Visit (HOSPITAL_COMMUNITY): Payer: Self-pay

## 2024-04-24 ENCOUNTER — Encounter (HOSPITAL_COMMUNITY): Payer: Self-pay

## 2024-04-25 DIAGNOSIS — J301 Allergic rhinitis due to pollen: Secondary | ICD-10-CM | POA: Diagnosis not present

## 2024-04-29 ENCOUNTER — Other Ambulatory Visit (HOSPITAL_COMMUNITY): Payer: Self-pay

## 2024-04-29 ENCOUNTER — Other Ambulatory Visit: Payer: Self-pay

## 2024-05-11 ENCOUNTER — Other Ambulatory Visit (HOSPITAL_COMMUNITY): Payer: Self-pay

## 2024-05-13 ENCOUNTER — Other Ambulatory Visit (HOSPITAL_COMMUNITY): Payer: Self-pay

## 2024-05-13 ENCOUNTER — Encounter (HOSPITAL_COMMUNITY): Payer: Self-pay

## 2024-05-19 ENCOUNTER — Other Ambulatory Visit (HOSPITAL_COMMUNITY): Payer: Self-pay

## 2024-05-19 ENCOUNTER — Other Ambulatory Visit: Payer: Self-pay

## 2024-05-19 MED ORDER — DICYCLOMINE HCL 10 MG PO CAPS
10.0000 mg | ORAL_CAPSULE | Freq: Four times a day (QID) | ORAL | 5 refills | Status: AC
  Filled 2024-05-19: qty 120, 30d supply, fill #0
  Filled 2024-08-25: qty 120, 30d supply, fill #1

## 2024-05-21 DIAGNOSIS — M531 Cervicobrachial syndrome: Secondary | ICD-10-CM | POA: Diagnosis not present

## 2024-05-21 DIAGNOSIS — M9905 Segmental and somatic dysfunction of pelvic region: Secondary | ICD-10-CM | POA: Diagnosis not present

## 2024-05-21 DIAGNOSIS — M546 Pain in thoracic spine: Secondary | ICD-10-CM | POA: Diagnosis not present

## 2024-05-21 DIAGNOSIS — M9902 Segmental and somatic dysfunction of thoracic region: Secondary | ICD-10-CM | POA: Diagnosis not present

## 2024-05-21 DIAGNOSIS — M9903 Segmental and somatic dysfunction of lumbar region: Secondary | ICD-10-CM | POA: Diagnosis not present

## 2024-05-21 DIAGNOSIS — M955 Acquired deformity of pelvis: Secondary | ICD-10-CM | POA: Diagnosis not present

## 2024-05-21 DIAGNOSIS — M545 Low back pain, unspecified: Secondary | ICD-10-CM | POA: Diagnosis not present

## 2024-05-21 DIAGNOSIS — M9901 Segmental and somatic dysfunction of cervical region: Secondary | ICD-10-CM | POA: Diagnosis not present

## 2024-05-27 ENCOUNTER — Other Ambulatory Visit (HOSPITAL_COMMUNITY): Payer: Self-pay

## 2024-05-28 ENCOUNTER — Other Ambulatory Visit (HOSPITAL_COMMUNITY): Payer: Self-pay

## 2024-05-29 ENCOUNTER — Other Ambulatory Visit (HOSPITAL_COMMUNITY): Payer: Self-pay

## 2024-05-29 ENCOUNTER — Encounter (HOSPITAL_COMMUNITY): Payer: Self-pay

## 2024-05-30 ENCOUNTER — Other Ambulatory Visit (HOSPITAL_COMMUNITY): Payer: Self-pay

## 2024-06-02 ENCOUNTER — Other Ambulatory Visit (HOSPITAL_COMMUNITY): Payer: Self-pay

## 2024-06-02 ENCOUNTER — Other Ambulatory Visit: Payer: Self-pay

## 2024-06-03 DIAGNOSIS — M531 Cervicobrachial syndrome: Secondary | ICD-10-CM | POA: Diagnosis not present

## 2024-06-03 DIAGNOSIS — M9903 Segmental and somatic dysfunction of lumbar region: Secondary | ICD-10-CM | POA: Diagnosis not present

## 2024-06-03 DIAGNOSIS — M545 Low back pain, unspecified: Secondary | ICD-10-CM | POA: Diagnosis not present

## 2024-06-03 DIAGNOSIS — M546 Pain in thoracic spine: Secondary | ICD-10-CM | POA: Diagnosis not present

## 2024-06-03 DIAGNOSIS — M955 Acquired deformity of pelvis: Secondary | ICD-10-CM | POA: Diagnosis not present

## 2024-06-03 DIAGNOSIS — M9902 Segmental and somatic dysfunction of thoracic region: Secondary | ICD-10-CM | POA: Diagnosis not present

## 2024-06-03 DIAGNOSIS — M9905 Segmental and somatic dysfunction of pelvic region: Secondary | ICD-10-CM | POA: Diagnosis not present

## 2024-06-03 DIAGNOSIS — M9901 Segmental and somatic dysfunction of cervical region: Secondary | ICD-10-CM | POA: Diagnosis not present

## 2024-06-09 DIAGNOSIS — M531 Cervicobrachial syndrome: Secondary | ICD-10-CM | POA: Diagnosis not present

## 2024-06-09 DIAGNOSIS — M9903 Segmental and somatic dysfunction of lumbar region: Secondary | ICD-10-CM | POA: Diagnosis not present

## 2024-06-09 DIAGNOSIS — M955 Acquired deformity of pelvis: Secondary | ICD-10-CM | POA: Diagnosis not present

## 2024-06-09 DIAGNOSIS — M546 Pain in thoracic spine: Secondary | ICD-10-CM | POA: Diagnosis not present

## 2024-06-09 DIAGNOSIS — M9901 Segmental and somatic dysfunction of cervical region: Secondary | ICD-10-CM | POA: Diagnosis not present

## 2024-06-09 DIAGNOSIS — M9902 Segmental and somatic dysfunction of thoracic region: Secondary | ICD-10-CM | POA: Diagnosis not present

## 2024-06-09 DIAGNOSIS — M545 Low back pain, unspecified: Secondary | ICD-10-CM | POA: Diagnosis not present

## 2024-06-09 DIAGNOSIS — M9905 Segmental and somatic dysfunction of pelvic region: Secondary | ICD-10-CM | POA: Diagnosis not present

## 2024-06-12 DIAGNOSIS — M955 Acquired deformity of pelvis: Secondary | ICD-10-CM | POA: Diagnosis not present

## 2024-06-12 DIAGNOSIS — M9901 Segmental and somatic dysfunction of cervical region: Secondary | ICD-10-CM | POA: Diagnosis not present

## 2024-06-12 DIAGNOSIS — M545 Low back pain, unspecified: Secondary | ICD-10-CM | POA: Diagnosis not present

## 2024-06-12 DIAGNOSIS — M9903 Segmental and somatic dysfunction of lumbar region: Secondary | ICD-10-CM | POA: Diagnosis not present

## 2024-06-12 DIAGNOSIS — M9905 Segmental and somatic dysfunction of pelvic region: Secondary | ICD-10-CM | POA: Diagnosis not present

## 2024-06-12 DIAGNOSIS — M546 Pain in thoracic spine: Secondary | ICD-10-CM | POA: Diagnosis not present

## 2024-06-12 DIAGNOSIS — M9902 Segmental and somatic dysfunction of thoracic region: Secondary | ICD-10-CM | POA: Diagnosis not present

## 2024-06-12 DIAGNOSIS — M531 Cervicobrachial syndrome: Secondary | ICD-10-CM | POA: Diagnosis not present

## 2024-06-17 DIAGNOSIS — M9903 Segmental and somatic dysfunction of lumbar region: Secondary | ICD-10-CM | POA: Diagnosis not present

## 2024-06-17 DIAGNOSIS — M9902 Segmental and somatic dysfunction of thoracic region: Secondary | ICD-10-CM | POA: Diagnosis not present

## 2024-06-17 DIAGNOSIS — M9905 Segmental and somatic dysfunction of pelvic region: Secondary | ICD-10-CM | POA: Diagnosis not present

## 2024-06-17 DIAGNOSIS — M9901 Segmental and somatic dysfunction of cervical region: Secondary | ICD-10-CM | POA: Diagnosis not present

## 2024-06-17 DIAGNOSIS — M545 Low back pain, unspecified: Secondary | ICD-10-CM | POA: Diagnosis not present

## 2024-06-17 DIAGNOSIS — M546 Pain in thoracic spine: Secondary | ICD-10-CM | POA: Diagnosis not present

## 2024-06-17 DIAGNOSIS — M955 Acquired deformity of pelvis: Secondary | ICD-10-CM | POA: Diagnosis not present

## 2024-06-17 DIAGNOSIS — M531 Cervicobrachial syndrome: Secondary | ICD-10-CM | POA: Diagnosis not present

## 2024-06-20 DIAGNOSIS — J301 Allergic rhinitis due to pollen: Secondary | ICD-10-CM | POA: Diagnosis not present

## 2024-06-24 DIAGNOSIS — M546 Pain in thoracic spine: Secondary | ICD-10-CM | POA: Diagnosis not present

## 2024-06-24 DIAGNOSIS — M9905 Segmental and somatic dysfunction of pelvic region: Secondary | ICD-10-CM | POA: Diagnosis not present

## 2024-06-24 DIAGNOSIS — M955 Acquired deformity of pelvis: Secondary | ICD-10-CM | POA: Diagnosis not present

## 2024-06-24 DIAGNOSIS — M9901 Segmental and somatic dysfunction of cervical region: Secondary | ICD-10-CM | POA: Diagnosis not present

## 2024-06-24 DIAGNOSIS — M9903 Segmental and somatic dysfunction of lumbar region: Secondary | ICD-10-CM | POA: Diagnosis not present

## 2024-06-24 DIAGNOSIS — M9902 Segmental and somatic dysfunction of thoracic region: Secondary | ICD-10-CM | POA: Diagnosis not present

## 2024-06-24 DIAGNOSIS — M531 Cervicobrachial syndrome: Secondary | ICD-10-CM | POA: Diagnosis not present

## 2024-06-24 DIAGNOSIS — M545 Low back pain, unspecified: Secondary | ICD-10-CM | POA: Diagnosis not present

## 2024-06-27 DIAGNOSIS — J301 Allergic rhinitis due to pollen: Secondary | ICD-10-CM | POA: Diagnosis not present

## 2024-07-01 DIAGNOSIS — M9905 Segmental and somatic dysfunction of pelvic region: Secondary | ICD-10-CM | POA: Diagnosis not present

## 2024-07-01 DIAGNOSIS — M546 Pain in thoracic spine: Secondary | ICD-10-CM | POA: Diagnosis not present

## 2024-07-01 DIAGNOSIS — M955 Acquired deformity of pelvis: Secondary | ICD-10-CM | POA: Diagnosis not present

## 2024-07-01 DIAGNOSIS — M9902 Segmental and somatic dysfunction of thoracic region: Secondary | ICD-10-CM | POA: Diagnosis not present

## 2024-07-01 DIAGNOSIS — M545 Low back pain, unspecified: Secondary | ICD-10-CM | POA: Diagnosis not present

## 2024-07-01 DIAGNOSIS — M531 Cervicobrachial syndrome: Secondary | ICD-10-CM | POA: Diagnosis not present

## 2024-07-01 DIAGNOSIS — M9903 Segmental and somatic dysfunction of lumbar region: Secondary | ICD-10-CM | POA: Diagnosis not present

## 2024-07-01 DIAGNOSIS — M9901 Segmental and somatic dysfunction of cervical region: Secondary | ICD-10-CM | POA: Diagnosis not present

## 2024-07-02 ENCOUNTER — Other Ambulatory Visit (HOSPITAL_COMMUNITY): Payer: Self-pay

## 2024-07-10 DIAGNOSIS — M531 Cervicobrachial syndrome: Secondary | ICD-10-CM | POA: Diagnosis not present

## 2024-07-10 DIAGNOSIS — M955 Acquired deformity of pelvis: Secondary | ICD-10-CM | POA: Diagnosis not present

## 2024-07-10 DIAGNOSIS — M9901 Segmental and somatic dysfunction of cervical region: Secondary | ICD-10-CM | POA: Diagnosis not present

## 2024-07-10 DIAGNOSIS — M545 Low back pain, unspecified: Secondary | ICD-10-CM | POA: Diagnosis not present

## 2024-07-10 DIAGNOSIS — M9905 Segmental and somatic dysfunction of pelvic region: Secondary | ICD-10-CM | POA: Diagnosis not present

## 2024-07-10 DIAGNOSIS — M546 Pain in thoracic spine: Secondary | ICD-10-CM | POA: Diagnosis not present

## 2024-07-10 DIAGNOSIS — M9902 Segmental and somatic dysfunction of thoracic region: Secondary | ICD-10-CM | POA: Diagnosis not present

## 2024-07-10 DIAGNOSIS — M9903 Segmental and somatic dysfunction of lumbar region: Secondary | ICD-10-CM | POA: Diagnosis not present

## 2024-07-22 DIAGNOSIS — H6983 Other specified disorders of Eustachian tube, bilateral: Secondary | ICD-10-CM | POA: Diagnosis not present

## 2024-07-22 DIAGNOSIS — J301 Allergic rhinitis due to pollen: Secondary | ICD-10-CM | POA: Diagnosis not present

## 2024-07-23 ENCOUNTER — Other Ambulatory Visit: Payer: Self-pay

## 2024-07-28 DIAGNOSIS — M9903 Segmental and somatic dysfunction of lumbar region: Secondary | ICD-10-CM | POA: Diagnosis not present

## 2024-07-28 DIAGNOSIS — M531 Cervicobrachial syndrome: Secondary | ICD-10-CM | POA: Diagnosis not present

## 2024-07-28 DIAGNOSIS — M9902 Segmental and somatic dysfunction of thoracic region: Secondary | ICD-10-CM | POA: Diagnosis not present

## 2024-07-28 DIAGNOSIS — M9905 Segmental and somatic dysfunction of pelvic region: Secondary | ICD-10-CM | POA: Diagnosis not present

## 2024-07-28 DIAGNOSIS — M545 Low back pain, unspecified: Secondary | ICD-10-CM | POA: Diagnosis not present

## 2024-07-28 DIAGNOSIS — M546 Pain in thoracic spine: Secondary | ICD-10-CM | POA: Diagnosis not present

## 2024-07-28 DIAGNOSIS — M9901 Segmental and somatic dysfunction of cervical region: Secondary | ICD-10-CM | POA: Diagnosis not present

## 2024-07-28 DIAGNOSIS — M955 Acquired deformity of pelvis: Secondary | ICD-10-CM | POA: Diagnosis not present

## 2024-08-11 DIAGNOSIS — M9901 Segmental and somatic dysfunction of cervical region: Secondary | ICD-10-CM | POA: Diagnosis not present

## 2024-08-11 DIAGNOSIS — M9905 Segmental and somatic dysfunction of pelvic region: Secondary | ICD-10-CM | POA: Diagnosis not present

## 2024-08-11 DIAGNOSIS — M955 Acquired deformity of pelvis: Secondary | ICD-10-CM | POA: Diagnosis not present

## 2024-08-11 DIAGNOSIS — M9903 Segmental and somatic dysfunction of lumbar region: Secondary | ICD-10-CM | POA: Diagnosis not present

## 2024-08-11 DIAGNOSIS — M545 Low back pain, unspecified: Secondary | ICD-10-CM | POA: Diagnosis not present

## 2024-08-11 DIAGNOSIS — M9902 Segmental and somatic dysfunction of thoracic region: Secondary | ICD-10-CM | POA: Diagnosis not present

## 2024-08-11 DIAGNOSIS — M531 Cervicobrachial syndrome: Secondary | ICD-10-CM | POA: Diagnosis not present

## 2024-08-11 DIAGNOSIS — M546 Pain in thoracic spine: Secondary | ICD-10-CM | POA: Diagnosis not present

## 2024-08-25 ENCOUNTER — Other Ambulatory Visit (HOSPITAL_COMMUNITY): Payer: Self-pay

## 2024-08-26 ENCOUNTER — Other Ambulatory Visit (HOSPITAL_COMMUNITY): Payer: Self-pay

## 2024-08-26 ENCOUNTER — Other Ambulatory Visit: Payer: Self-pay

## 2024-08-26 MED ORDER — MONTELUKAST SODIUM 10 MG PO TABS
10.0000 mg | ORAL_TABLET | Freq: Every day | ORAL | 3 refills | Status: AC
  Filled 2024-08-26: qty 90, 90d supply, fill #0

## 2024-08-26 MED ORDER — LEVOCETIRIZINE DIHYDROCHLORIDE 5 MG PO TABS
5.0000 mg | ORAL_TABLET | Freq: Every evening | ORAL | 2 refills | Status: AC
  Filled 2024-08-26 – 2024-10-06 (×5): qty 90, 90d supply, fill #0

## 2024-08-27 ENCOUNTER — Other Ambulatory Visit (HOSPITAL_COMMUNITY): Payer: Self-pay

## 2024-08-27 DIAGNOSIS — L700 Acne vulgaris: Secondary | ICD-10-CM | POA: Diagnosis not present

## 2024-08-27 DIAGNOSIS — L219 Seborrheic dermatitis, unspecified: Secondary | ICD-10-CM | POA: Diagnosis not present

## 2024-08-27 DIAGNOSIS — L2084 Intrinsic (allergic) eczema: Secondary | ICD-10-CM | POA: Diagnosis not present

## 2024-08-29 ENCOUNTER — Other Ambulatory Visit: Payer: Self-pay

## 2024-08-29 ENCOUNTER — Other Ambulatory Visit (HOSPITAL_COMMUNITY): Payer: Self-pay

## 2024-08-29 MED ORDER — TRETINOIN 0.05 % EX CREA
TOPICAL_CREAM | CUTANEOUS | 11 refills | Status: AC
  Filled 2024-08-29: qty 45, 30d supply, fill #0

## 2024-08-29 MED ORDER — CLINDAMYCIN PHOS-BENZOYL PEROX 1-5 % EX GEL
CUTANEOUS | 11 refills | Status: AC
  Filled 2024-08-29: qty 25, 30d supply, fill #0

## 2024-08-29 MED ORDER — TRIAMCINOLONE ACETONIDE 0.1 % EX CREA
TOPICAL_CREAM | CUTANEOUS | 11 refills | Status: AC
  Filled 2024-08-29: qty 454, 90d supply, fill #0

## 2024-08-29 MED ORDER — EUCRISA 2 % EX OINT
TOPICAL_OINTMENT | CUTANEOUS | 11 refills | Status: AC
  Filled 2024-08-29: qty 60, 30d supply, fill #0

## 2024-08-29 MED ORDER — CLOBETASOL PROPIONATE 0.05 % EX CREA
TOPICAL_CREAM | CUTANEOUS | 11 refills | Status: AC
  Filled 2024-08-29: qty 60, 30d supply, fill #0
  Filled 2024-09-09: qty 60, 30d supply, fill #1

## 2024-08-29 MED ORDER — KETOCONAZOLE 2 % EX SHAM
MEDICATED_SHAMPOO | CUTANEOUS | 11 refills | Status: AC
  Filled 2024-08-29 – 2024-09-09 (×2): qty 120, 30d supply, fill #0

## 2024-08-29 MED ORDER — CLOBETASOL PROPIONATE 0.05 % EX SOLN
CUTANEOUS | 11 refills | Status: AC
  Filled 2024-08-29: qty 50, 30d supply, fill #0

## 2024-08-31 ENCOUNTER — Other Ambulatory Visit (HOSPITAL_COMMUNITY): Payer: Self-pay

## 2024-09-01 ENCOUNTER — Other Ambulatory Visit: Payer: Self-pay

## 2024-09-01 ENCOUNTER — Other Ambulatory Visit (HOSPITAL_COMMUNITY): Payer: Self-pay

## 2024-09-09 ENCOUNTER — Other Ambulatory Visit: Payer: Self-pay

## 2024-09-09 ENCOUNTER — Other Ambulatory Visit (HOSPITAL_COMMUNITY): Payer: Self-pay

## 2024-09-30 ENCOUNTER — Other Ambulatory Visit: Payer: Self-pay

## 2024-09-30 ENCOUNTER — Other Ambulatory Visit (HOSPITAL_COMMUNITY): Payer: Self-pay

## 2024-10-06 ENCOUNTER — Other Ambulatory Visit: Payer: Self-pay

## 2024-10-06 ENCOUNTER — Other Ambulatory Visit (HOSPITAL_COMMUNITY): Payer: Self-pay

## 2024-10-06 MED ORDER — FLUTICASONE PROPIONATE 50 MCG/ACT NA SUSP: 1.0000 | Freq: Two times a day (BID) | NASAL | 3 refills | Status: AC

## 2024-10-07 ENCOUNTER — Other Ambulatory Visit (HOSPITAL_COMMUNITY): Payer: Self-pay
